# Patient Record
Sex: Male | Born: 1935
Health system: Southern US, Community
[De-identification: ages and names within clinical notes are randomized; demographics above are authoritative.]

## PROBLEM LIST (undated history)

## (undated) DIAGNOSIS — G20 Parkinson's disease: Secondary | ICD-10-CM

## (undated) DIAGNOSIS — G20A1 Parkinson's disease without dyskinesia, without mention of fluctuations: Secondary | ICD-10-CM

## (undated) DIAGNOSIS — K118 Other diseases of salivary glands: Secondary | ICD-10-CM

## (undated) DIAGNOSIS — H532 Diplopia: Secondary | ICD-10-CM

## (undated) DIAGNOSIS — E785 Hyperlipidemia, unspecified: Secondary | ICD-10-CM

## (undated) DIAGNOSIS — R079 Chest pain, unspecified: Secondary | ICD-10-CM

## (undated) DIAGNOSIS — E7800 Pure hypercholesterolemia, unspecified: Secondary | ICD-10-CM

## (undated) DIAGNOSIS — I2510 Atherosclerotic heart disease of native coronary artery without angina pectoris: Secondary | ICD-10-CM

## (undated) DIAGNOSIS — I872 Venous insufficiency (chronic) (peripheral): Secondary | ICD-10-CM

## (undated) DIAGNOSIS — I519 Heart disease, unspecified: Secondary | ICD-10-CM

## (undated) DIAGNOSIS — R42 Dizziness and giddiness: Secondary | ICD-10-CM

## (undated) DIAGNOSIS — R6881 Early satiety: Secondary | ICD-10-CM

## (undated) DIAGNOSIS — I10 Essential (primary) hypertension: Secondary | ICD-10-CM

## (undated) DIAGNOSIS — R2681 Unsteadiness on feet: Secondary | ICD-10-CM

## (undated) DIAGNOSIS — H538 Other visual disturbances: Secondary | ICD-10-CM

## (undated) DIAGNOSIS — I5189 Other ill-defined heart diseases: Secondary | ICD-10-CM

## (undated) HISTORY — DX: Dizziness and giddiness: R42

## (undated) HISTORY — DX: Venous insufficiency (chronic) (peripheral): I87.2

## (undated) HISTORY — DX: Other ill-defined heart diseases: I51.89

## (undated) HISTORY — DX: Other visual disturbances: H53.8

## (undated) HISTORY — DX: Early satiety: R68.81

## (undated) HISTORY — DX: Other diseases of salivary glands: K11.8

## (undated) HISTORY — DX: Chest pain, unspecified: R07.9

## (undated) HISTORY — DX: Atherosclerotic heart disease of native coronary artery without angina pectoris: I25.10

## (undated) HISTORY — DX: Essential (primary) hypertension: I10

## (undated) HISTORY — DX: Hyperlipidemia, unspecified: E78.5

## (undated) HISTORY — DX: Heart disease, unspecified: I51.9

## (undated) HISTORY — DX: Unsteadiness on feet: R26.81

## (undated) HISTORY — DX: Diplopia: H53.2

## (undated) HISTORY — DX: Pure hypercholesterolemia, unspecified: E78.00

---

## 1998-11-11 ENCOUNTER — Encounter (HOSPITAL_BASED_OUTPATIENT_CLINIC_OR_DEPARTMENT_OTHER): Payer: Self-pay | Admitting: General Surgery

## 1998-11-14 ENCOUNTER — Ambulatory Visit (HOSPITAL_COMMUNITY): Admission: RE | Admit: 1998-11-14 | Discharge: 1998-11-14 | Payer: Self-pay | Admitting: General Surgery

## 2000-04-21 ENCOUNTER — Emergency Department (HOSPITAL_COMMUNITY): Admission: EM | Admit: 2000-04-21 | Discharge: 2000-04-21 | Payer: Self-pay | Admitting: Emergency Medicine

## 2005-10-15 DIAGNOSIS — R079 Chest pain, unspecified: Secondary | ICD-10-CM

## 2005-10-15 HISTORY — DX: Chest pain, unspecified: R07.9

## 2007-10-16 HISTORY — PX: CORONARY ANGIOPLASTY: SHX604

## 2007-12-08 ENCOUNTER — Inpatient Hospital Stay (HOSPITAL_BASED_OUTPATIENT_CLINIC_OR_DEPARTMENT_OTHER): Admission: RE | Admit: 2007-12-08 | Discharge: 2007-12-08 | Payer: Self-pay | Admitting: Interventional Cardiology

## 2010-01-04 ENCOUNTER — Ambulatory Visit (HOSPITAL_COMMUNITY): Admission: RE | Admit: 2010-01-04 | Discharge: 2010-01-04 | Payer: Self-pay | Admitting: General Surgery

## 2011-02-27 NOTE — Cardiovascular Report (Signed)
NAMEPEDER, ALLUMS NO.:  [id number]   MEDICAL RECORD NO.:  [id number]          PATIENT TYPE:  OIB   LOCATION:  1963                         FACILITY:  [facility]   PHYSICIAN:  Lyn Records, M.D.   DATE OF BIRTH:  April 16, 1935   DATE OF PROCEDURE:  12/08/2007  DATE OF DISCHARGE:                            CARDIAC CATHETERIZATION   INDICATIONS FOR PROCEDURE:  Exertional chest burning, prior abnormal  Cardiolite study demonstrating possible inferior wall ischemia in 2005.  Study is being done to define coronary anatomy.   PROCEDURE PERFORMED:  1. Left heart catheterization.  2. Selective coronary angiography.  3. Left ventriculography.   DESCRIPTION:  After informed consent, a 4-French sheath was placed  in  the right femoral artery using a modified Seldinger technique.  A 4-  [place] A2 multipurpose catheter was then used for hemodynamic  recordings, left ventriculography by hand injection, and selective right  coronary angiography.  A 4-French #4 left Judkins catheter was used for  left coronary angiography.  The patient tolerated the procedure without  complication.  Manual compression was used for hemostasis.   RESULTS:  1. Hemodynamic data:  (a) Aortic pressure 117/62.  (b) Left ventricular pressure 119/5.  1. Left ventriculography:  The left ventricle was normal in size.      Contractility is normal.  The ejection fraction is 70%.  2. Coronary angiography:  (a) Left main coronary:  Normal.  (b) Left anterior descending coronary:  Left anterior descending  coronary artery is a large vessel that wraps around left ventricular  apex.  There is an eccentric focal plaque in the ostium of LAD that  obstructs the vessel by less than 25%.  The LAD proper gives origin to  large branching first diagonal that arises from the mid vessel.  The  diagonal was normal.  The LAD beyond the diagonal contains luminal  irregularities, but it is also widely patent.  c)  Circumflex artery:  The circumflex coronary artery is a large vessel  tortuous that gives origin to 2 obtuse marginal branches, the mid vessel  contains a very tortuous segment.  The 2 obtuse marginals are large and  normal.  Irregularities are noted within the region of tortuosity.  (d) Right coronary:  The right coronary artery is also a very tortuous  vessel without any significant obstruction.  PDA is normal.  There was  contrast reflux into the aorta on injection.   CONCLUSION:  1. Normal left ventricular function.  2. Widely patent coronaries.  The patient does have a very eccentric,      focal, ostial LAD lesion that obstructs the vessel by less than      25%.  For the most part, otherwise the vessels are widely patent      with only minimal luminal irregularity.  There is significant      tortuosity in the right coronary and the circumflex.  3. Exertional chest burning of uncertain etiology, not felt to be      ischemic in origin.  Possibilities include COPD versus other.   PLAN:  1. Continue aspirin.  2. Continue antihypertensive therapy.  3. Continue low-dose statin therapy for the ostial LAD plaque.      Lyn Records, M.D.  Electronically Signed     HWS/MEDQ  D:  12/08/2007  T:  12/08/2007  Job:  [id number]   cc:   Lorelle Formosa, M.D.

## 2011-03-02 NOTE — Procedures (Signed)
[place]  Patient:    Gavin Campbell, Gavin Campbell                        MRN: [id number] Proc. Date: 04/21/00 Attending:  Roosvelt Harps, M.D. CC:         Mardene Celeste. Lurene Shadow, M.D.                           Procedure Report  PROCEDURE PERFORMED:  Video upper endoscopy.  ENDOSCOPIST:  Roosvelt Harps, M.D.  INDICATIONS FOR PROCEDURE:  Possible foreign body in the proximal esophagus. The patient is a 76 year old male who says he swallowed a toothpick several days ago and feels it lodged high in the cervical area of the esophagus.  He has no significant underlying medical conditions and is taking no chronic medications.  PHYSICAL EXAMINATION:  Neck is supple without thyromegaly, adenopathy or crepitus.  Chest is clear.  Heart sounds are regular rate and rhythm without murmurs, gallops or rubs and the abdomen is benign.  PREPARATION:  He is n.p.o. since midnight.  PREPROCEDURE SEDATION:  He received 50 mg of Demerol and 5 mg of Versed intravenously.  In addition, his throat was anesthetized with Cetacaine spray and he was on 2L of nasal cannula O2.  DESCRIPTION OF PROCEDURE:  The Olympus video upper endoscope was inserted via the mouth and advanced easily through the upper esophageal sphincter.  Careful attention was paid to the posterior pharyngeal area in the proximal esophagus. When nothing was noticed, intubation was carried on down to the descending duodenum.  On withdrawal, the mucosa was carefully evaluated.  The descending duodenum, bulb, entire stomach and retroflex view of the gastroesophageal junction were normal.  The scope was withdrawn through the entire length of the esophagus into the posterior pharynx and both vallecular areas were re-examined.  The esophagus was reintubated three times and no foreign body could be located in the proximal esophagus.  The patient tolerated the procedure well.  Pulse, blood pressure and oximetry testing were  stable throughout.  He was observed in recovery for 45 minutes and discharged home alert with a benign abdomen.  IMPRESSION:  Foreign body sensation but no obvious foreign body still present in the proximal esophagus or posterior pharynx.  PLAN:  Patient is to gargle with Chloroseptic and call me within 24 to 48 hours if the symptoms are persisting.  At that time we will get a soft tissue film of the neck and consider ENT referral if the problem persists. DD:  04/21/00 TD:  04/21/00 Job: [id number]

## 2015-10-16 DIAGNOSIS — K118 Other diseases of salivary glands: Secondary | ICD-10-CM

## 2015-10-16 HISTORY — DX: Other diseases of salivary glands: K11.8

## 2015-11-02 ENCOUNTER — Other Ambulatory Visit: Payer: Self-pay | Admitting: Family Medicine

## 2015-11-02 DIAGNOSIS — R42 Dizziness and giddiness: Secondary | ICD-10-CM

## 2015-11-08 ENCOUNTER — Ambulatory Visit
Admission: RE | Admit: 2015-11-08 | Discharge: 2015-11-08 | Disposition: A | Discharge status: Home / Self Care | Payer: Medicare Other | Source: Ambulatory Visit | Attending: Family Medicine | Admitting: Family Medicine

## 2015-11-08 DIAGNOSIS — R42 Dizziness and giddiness: Secondary | ICD-10-CM

## 2015-11-10 ENCOUNTER — Other Ambulatory Visit: Payer: Self-pay

## 2015-11-29 DIAGNOSIS — K118 Other diseases of salivary glands: Secondary | ICD-10-CM | POA: Insufficient documentation

## 2016-02-06 ENCOUNTER — Ambulatory Visit: Payer: Self-pay | Admitting: Sports Medicine

## 2016-02-13 ENCOUNTER — Ambulatory Visit (INDEPENDENT_AMBULATORY_CARE_PROVIDER_SITE_OTHER): Payer: Medicare Other | Admitting: Sports Medicine

## 2016-02-13 ENCOUNTER — Encounter: Payer: Self-pay | Admitting: Sports Medicine

## 2016-02-13 DIAGNOSIS — M79675 Pain in left toe(s): Secondary | ICD-10-CM

## 2016-02-13 DIAGNOSIS — M79674 Pain in right toe(s): Secondary | ICD-10-CM

## 2016-02-13 DIAGNOSIS — B351 Tinea unguium: Secondary | ICD-10-CM | POA: Diagnosis not present

## 2016-02-13 NOTE — Progress Notes (Signed)
Patient ID: Gavin Campbell, male   DOB: May 01, 1935, 81 y.o.   MRN: [id number] Subjective: Gavin Campbell is a 81 y.o. male patient seen today in office with complaint of painful thickened and elongated toenails; unable to trim. Patient denies history of Diabetes, Neuropathy, or Vascular disease. Patient has no other pedal complaints at this time.   Patient Active Problem List   Diagnosis Date Noted  . Mass of parotid gland 11/29/2015    No current outpatient prescriptions on file prior to visit.   No current facility-administered medications on file prior to visit.    No Known Allergies  Objective: Physical Exam  General: Well developed, nourished, no acute distress, awake, alert and oriented x 3  Vascular: Dorsalis pedis artery 2/4 bilateral, Posterior tibial artery 1/4 bilateral, skin temperature warm to warm proximal to distal bilateral lower extremities, no varicosities, pedal hair present bilateral.  Neurological: Gross sensation present via light touch bilateral.   Dermatological: Skin is warm, dry, and supple bilateral, Nails 1-10 are tender, long, thick, and discolored with mild subungal debris, no webspace macerations present bilateral, no open lesions present bilateral, no callus/corns/hyperkeratotic tissue present bilateral. No signs of infection bilateral.  Musculoskeletal: Bunion and hammertoe deformities noted bilateral. Muscular strength within normal limits without painon range of motion. No pain with calf compression bilateral.  Assessment and Plan:  Problem List Items Addressed This Visit    None    Visit Diagnoses    Dermatophytosis of nail    -  Primary    Toe pain, bilateral           -Examined patient.  -Discussed treatment options for painful mycotic nails. -Mechanically debrided and reduced mycotic nails with sterile nail nipper and dremel nail file without incident. -Recommend good support shoes for foot type -Patient to return in 3 months for  follow up evaluation or sooner if symptoms worsen.  Landis Martins, DPM

## 2016-02-27 ENCOUNTER — Other Ambulatory Visit: Payer: Self-pay | Admitting: Otolaryngology

## 2016-02-27 DIAGNOSIS — R490 Dysphonia: Secondary | ICD-10-CM | POA: Insufficient documentation

## 2016-02-27 DIAGNOSIS — J302 Other seasonal allergic rhinitis: Secondary | ICD-10-CM | POA: Insufficient documentation

## 2016-02-27 DIAGNOSIS — K118 Other diseases of salivary glands: Secondary | ICD-10-CM

## 2016-02-29 ENCOUNTER — Other Ambulatory Visit (HOSPITAL_COMMUNITY): Payer: Self-pay | Admitting: Otolaryngology

## 2016-02-29 DIAGNOSIS — K118 Other diseases of salivary glands: Secondary | ICD-10-CM

## 2016-03-06 ENCOUNTER — Ambulatory Visit (HOSPITAL_COMMUNITY)
Admission: RE | Admit: 2016-03-06 | Discharge: 2016-03-06 | Disposition: A | Discharge status: Home / Self Care | Payer: Medicare Other | Source: Ambulatory Visit | Attending: Otolaryngology | Admitting: Otolaryngology

## 2016-03-06 DIAGNOSIS — K119 Disease of salivary gland, unspecified: Secondary | ICD-10-CM | POA: Insufficient documentation

## 2016-03-06 DIAGNOSIS — K118 Other diseases of salivary glands: Secondary | ICD-10-CM

## 2016-03-16 ENCOUNTER — Ambulatory Visit (HOSPITAL_COMMUNITY)
Admission: RE | Admit: 2016-03-16 | Discharge: 2016-03-16 | Disposition: A | Discharge status: Home / Self Care | Payer: Medicare Other | Source: Ambulatory Visit | Attending: Otolaryngology | Admitting: Otolaryngology

## 2016-03-16 ENCOUNTER — Encounter (HOSPITAL_COMMUNITY): Payer: Self-pay

## 2016-03-16 ENCOUNTER — Other Ambulatory Visit (HOSPITAL_COMMUNITY): Payer: Self-pay | Admitting: Otolaryngology

## 2016-03-16 DIAGNOSIS — Z87891 Personal history of nicotine dependence: Secondary | ICD-10-CM | POA: Insufficient documentation

## 2016-03-16 DIAGNOSIS — D110 Benign neoplasm of parotid gland: Secondary | ICD-10-CM | POA: Diagnosis not present

## 2016-03-16 DIAGNOSIS — Z7982 Long term (current) use of aspirin: Secondary | ICD-10-CM | POA: Insufficient documentation

## 2016-03-16 DIAGNOSIS — K118 Other diseases of salivary glands: Secondary | ICD-10-CM

## 2016-03-16 DIAGNOSIS — R220 Localized swelling, mass and lump, head: Secondary | ICD-10-CM | POA: Diagnosis present

## 2016-03-16 LAB — APTT: APTT: 30 s (ref 24–37)

## 2016-03-16 LAB — CBC
HCT: 42.1 % (ref 39.0–52.0)
Hemoglobin: 13.5 g/dL (ref 13.0–17.0)
MCH: 27.5 pg (ref 26.0–34.0)
MCHC: 32.1 g/dL (ref 30.0–36.0)
MCV: 85.7 fL (ref 78.0–100.0)
PLATELETS: 157 10*3/uL (ref 150–400)
RBC: 4.91 MIL/uL (ref 4.22–5.81)
RDW: 14.3 % (ref 11.5–15.5)
WBC: 6 10*3/uL (ref 4.0–10.5)

## 2016-03-16 LAB — PROTIME-INR
INR: 1.08 (ref 0.00–1.49)
PROTHROMBIN TIME: 14.2 s (ref 11.6–15.2)

## 2016-03-16 MED ORDER — FENTANYL CITRATE (PF) 100 MCG/2ML IJ SOLN
INTRAMUSCULAR | Status: AC | PRN
Start: 2016-03-16 — End: 2016-03-16
  Administered 2016-03-16: 25 ug via INTRAVENOUS

## 2016-03-16 MED ORDER — MIDAZOLAM HCL 2 MG/2ML IJ SOLN
INTRAMUSCULAR | Status: AC
  Filled 2016-03-16: qty 2

## 2016-03-16 MED ORDER — MIDAZOLAM HCL 2 MG/2ML IJ SOLN
INTRAMUSCULAR | Status: AC | PRN
  Administered 2016-03-16: 1 mg via INTRAVENOUS

## 2016-03-16 MED ORDER — LIDOCAINE HCL (PF) 1 % IJ SOLN
INTRAMUSCULAR | Status: AC
  Filled 2016-03-16: qty 10

## 2016-03-16 MED ORDER — FENTANYL CITRATE (PF) 100 MCG/2ML IJ SOLN
INTRAMUSCULAR | Status: AC
  Filled 2016-03-16: qty 2

## 2016-03-16 MED ORDER — SODIUM CHLORIDE 0.9 % IV SOLN: INTRAVENOUS | Status: DC

## 2016-03-16 NOTE — H&P (Signed)
Chief Complaint: Patient was seen in consultation today for left parotid gland mass biopsy at the request of [facility]  Referring Physician(s): Jerrell Belfast  Supervising Physician: Corrie Mckusick  Patient Status: Out-pt  History of Present Illness: Gavin Campbell is a 81 y.o. male   Pt was seen for dizziness and blurred vision 10/2015 Incidental L parotid mass noted Follow up US still reveals mass 03/06/16: IMPRESSION: Unchanged morphologic appearance of the approximately 2.4 cm cystic lesion within the left parotid gland. Fall stability suggests a benign etiology, this lesion remains indeterminate and ENT consultation is advised.  Was referred to Dr Wilburn Cornelia and bx requested   History reviewed. No pertinent past medical history.  History reviewed. No pertinent past surgical history.  Allergies: Review of patient's allergies indicates no known allergies.  Medications: Prior to Admission medications   Medication Sig Start Date End Date Taking? Authorizing Provider  aspirin 81 MG tablet Take 81 mg by mouth daily.   Yes Historical Provider, MD     History reviewed. No pertinent family history.  Social History   Social History  . Marital Status: Married    Spouse Name: N/A  . Number of Children: N/A  . Years of Education: N/A   Social History Main Topics  . Smoking status: Former Research scientist (life sciences)  . Smokeless tobacco: None  . Alcohol Use: None  . Drug Use: None  . Sexual Activity: Not Asked   Other Topics Concern  . None   Social History Narrative     Review of Systems: A 12 point ROS discussed and pertinent positives are indicated in the HPI above.  All other systems are negative.  Review of Systems  Constitutional: Negative for fever, activity change, appetite change and fatigue.  Respiratory: Negative for shortness of breath.   Musculoskeletal: Negative for neck pain.  Neurological: Negative for weakness.  Psychiatric/Behavioral: Negative for  behavioral problems and confusion.    Vital Signs: BP 133/78 mmHg  Pulse 64  Temp(Src) 97.9 F (36.6 C)  Resp 16  Ht 5\' 11"  (1.803 m)  Wt 157 lb (71.215 kg)  BMI 21.91 kg/m2  SpO2 100%  Physical Exam  Constitutional: He is oriented to person, place, and time.  Cardiovascular: Normal rate, regular rhythm and normal heart sounds.   Pulmonary/Chest: Effort normal and breath sounds normal. He has no wheezes.  Abdominal: Soft. Bowel sounds are normal. There is no tenderness.  Musculoskeletal: Normal range of motion.  Neurological: He is alert and oriented to person, place, and time.  Skin: Skin is warm and dry.  Psychiatric: He has a normal mood and affect. His behavior is normal. Judgment and thought content normal.  Nursing note and vitals reviewed.   Mallampati Score:  MD Evaluation Airway: WNL Heart: WNL Abdomen: WNL Chest/ Lungs: WNL ASA  Classification: 2 Mallampati/Airway Score: One  Imaging: US Soft Tissue Head/neck  03/06/2016  CLINICAL DATA:  Left parotid mass seen on MRI. EXAM: ULTRASOUND OF HEAD/NECK SOFT TISSUES TECHNIQUE: Ultrasound examination of the head and neck soft tissues was performed in the area of clinical concern. COMPARISON:  Brain MRI -11/07/2025 FINDINGS: Grossly unchanged appearance of slightly serpiginous approximately 2.4 x 1.9 x 1.4 cm anechoic cystic lesion within the left parotid gland, morphologically similar to the 10/2015 brain MRI. No definitive new or enlarging parotid lesions. No evidence of parotid ductal dilatation. No evidence of regional cervical lymphadenopathy. IMPRESSION: Unchanged morphologic appearance of the approximately 2.4 cm cystic lesion within the left parotid gland. Fall stability suggests a  benign etiology, this lesion remains indeterminate and ENT consultation is advised. Electronically Signed   By: Sandi Mariscal M.D.   On: 03/06/2016 16:04    Labs:  CBC:  Recent Labs  03/16/16 1157  WBC 6.0  HGB 13.5  HCT 42.1  PLT  157    COAGS:  Recent Labs  03/16/16 1157  INR 1.08  APTT 30    BMP: No results for input(s): NA, K, CL, CO2, GLUCOSE, BUN, CALCIUM, CREATININE, GFRNONAA, GFRAA in the last 8760 hours.  Invalid input(s): CMP  LIVER FUNCTION TESTS: No results for input(s): BILITOT, AST, ALT, ALKPHOS, PROT, ALBUMIN in the last 8760 hours.  TUMOR MARKERS: No results for input(s): AFPTM, CEA, CA199, CHROMGRNA in the last 8760 hours.  Assessment and Plan:  Left parotid gland mass persistent since 10/2015 Now for bx Risks and Benefits discussed with the patient including, but not limited to bleeding, infection, damage to adjacent structures or low yield requiring additional tests. All of the patient's questions were answered, patient is agreeable to proceed. Consent signed and in chart.    Thank you for this interesting consult.  I greatly enjoyed meeting CARRELL GENTILCORE and look forward to participating in their care.  A copy of this report was sent to the requesting provider on this date.  Electronically Signed: Monia Sabal A 03/16/2016, 12:13 PM   I spent a total of 30 minutes    in face to face in clinical consultation, greater than 50% of which was counseling/coordinating care for Left parotid gland mass bx

## 2016-03-16 NOTE — Sedation Documentation (Signed)
Vital signs stable. 

## 2016-03-16 NOTE — Procedures (Signed)
Interventional Radiology Procedure Note  Procedure: US guided left parotid mass biopsy.  2 x 18g core.  Complications: None Recommendations:  - Ok to shower tomorrow - Do not submerge for 7 days - Routine line care    Signed,  Dulcy Fanny. Earleen Newport, DO

## 2016-03-16 NOTE — Sedation Documentation (Signed)
Patient is resting comfortably. 

## 2016-03-16 NOTE — Discharge Instructions (Signed)
Needle Biopsy, Care After °These instructions give you information about caring for yourself after your procedure. Your doctor may also give you more specific instructions. Call your doctor if you have any problems or questions after your procedure. °HOME CARE °· Rest as told by your doctor. °· Take medicines only as told by your doctor. °· There are many different ways to close and cover the biopsy site, including stitches (sutures), skin glue, and adhesive strips. Follow instructions from your doctor about: °¨ How to take care of your biopsy site. °¨ When and how you should change your bandage (dressing). °¨ When you should remove your dressing. °¨ Removing whatever was used to close your biopsy site. °· Check your biopsy site every day for signs of infection. Watch for: °¨ Redness, swelling, or pain. °¨ Fluid, blood, or pus. °GET HELP IF: °· You have a fever. °· You have redness, swelling, or pain at the biopsy site, and it lasts longer than a few days. °· You have fluid, blood, or pus coming from the biopsy site. °· You feel sick to your stomach (nauseous). °· You throw up (vomit). °GET HELP RIGHT AWAY IF: °· You are short of breath. °· You have trouble breathing. °· Your chest hurts. °· You feel dizzy or you pass out (faint). °· You have bleeding that does not stop with pressure or a bandage. °· You cough up blood. °· Your belly (abdomen) hurts. °  °This information is not intended to replace advice given to you by your health care provider. Make sure you discuss any questions you have with your health care provider. °  °Document Released: 09/13/2008 Document Revised: 02/15/2015 Document Reviewed: 09/27/2014 °Elsevier Interactive Patient Education ©2016 [employer] ° °

## 2016-10-23 DIAGNOSIS — K119 Disease of salivary gland, unspecified: Secondary | ICD-10-CM | POA: Diagnosis not present

## 2016-10-23 DIAGNOSIS — E7800 Pure hypercholesterolemia, unspecified: Secondary | ICD-10-CM | POA: Diagnosis not present

## 2016-10-23 DIAGNOSIS — I10 Essential (primary) hypertension: Secondary | ICD-10-CM | POA: Diagnosis not present

## 2017-02-25 DIAGNOSIS — R42 Dizziness and giddiness: Secondary | ICD-10-CM | POA: Diagnosis not present

## 2017-02-25 DIAGNOSIS — I872 Venous insufficiency (chronic) (peripheral): Secondary | ICD-10-CM | POA: Diagnosis not present

## 2017-02-25 DIAGNOSIS — I10 Essential (primary) hypertension: Secondary | ICD-10-CM | POA: Diagnosis not present

## 2017-03-14 DIAGNOSIS — R002 Palpitations: Secondary | ICD-10-CM | POA: Diagnosis not present

## 2017-04-03 DIAGNOSIS — R079 Chest pain, unspecified: Secondary | ICD-10-CM | POA: Insufficient documentation

## 2017-04-03 NOTE — Progress Notes (Signed)
Cardiology Office Note    Date:  04/04/2017   ID:  Gavin Campbell, DOB 02-03-1935, MRN [id number]  PCP:  Vernie Shanks, MD  Cardiologist:  Dr. Tamala Julian   CC: palpitations  History of Present Illness:  Gavin Campbell is a 82 y.o. male with a history of HTN, HLD, non obstructive CAD who presents to clinic for new patient evaluation of palpitations.   He has a history of an abnormal nuclear stress test in 2005 and exertional chest burning with subsequent cath in 2006 that showed widely patent coronary arteries with 25% oLAD stenosis.   Labs reviewed from 10/2016: Creat 1.11, K 4.4, AST/ALT 20/16, TC 129, TG 82, HDL 43, LDL 70.   He recently saw Dr. Harrington Challenger for evaluation of palpitations. Last week he had two episodes of palpitations that lasted for minutes. One episode was so significant that he almost called EMS, but then it self resolved. He is unable to explain these palpitations but he thinks his heart felt like his heart was racing. He did not feel dizzy or have syncope, although he does get dizzy from time to time with positional changes. No chest pain but had associated SOB. Also recently feeling like he has been having some memory issues. He actually missed two red lights because it did not "click" that he needed to stop.  He feels like he is "slow to get moving" and has to wait a little bit before he can "get going." Also, said his friend noticed his hand tremoring. Patient is somewhat of a difficult historian.     Past Medical History:  Diagnosis Date  . Blurred vision   . Chest pain 2007   ABNORMAL CARDIOLITE   . Coronary arteriosclerosis in native artery   . Diplopia   . Early satiety   . Hyperlipidemia   . Hypertension   . Left ventricular systolic dysfunction   . Orthostatic dizziness   . Parotid mass   . Pure hypercholesterolemia   . Unsteady gait   . Venous stasis dermatitis of right lower extremity     Past Surgical History:  Procedure Laterality Date  .  CORONARY ANGIOPLASTY  2009   NON OBSTRUCTIVE DISEASE    Current Medications: Outpatient Medications Prior to Visit  Medication Sig Dispense Refill  . amLODipine (NORVASC) 2.5 MG tablet Take 2.5 mg by mouth daily.    Marland Kitchen aspirin 81 MG tablet Take 81 mg by mouth daily.    . Coenzyme Q10 10 MG capsule Take 10 mg by mouth daily.    . metoprolol succinate (TOPROL-XL) 25 MG 24 hr tablet Take 25 mg by mouth daily.    . ranitidine (ZANTAC) 150 MG tablet Take 150 mg by mouth at bedtime.    . simvastatin (ZOCOR) 20 MG tablet Take 20 mg by mouth daily.    . TRIAMCINOLONE ACETONIDE EX Apply topically 2 (two) times daily.     No facility-administered medications prior to visit.      Allergies:   Patient has no known allergies.   Social History   Social History  . Marital status: Married    Spouse name: N/A  . Number of children: N/A  . Years of education: N/A   Social History Main Topics  . Smoking status: Former Smoker    Quit date: 04/03/1969  . Smokeless tobacco: Never Used  . Alcohol use No  . Drug use: No  . Sexual activity: Not Asked     Comment: MARRIED  Other Topics Concern  . None   Social History Narrative  . None     Family History:  The patient's family history includes Healthy in his father and mother.     ROS:   Please see the history of present illness.    ROS All other systems reviewed and are negative.   PHYSICAL EXAM:   VS:  BP 118/78 (BP Location: Left Arm)   Pulse 64   Ht 5\' 11"  (1.803 m)   Wt 153 lb 12.8 oz (69.8 kg)   BMI 21.45 kg/m    GEN: Well nourished, well developed, in no acute distress  HEENT: normal  Neck: no JVD, carotid bruits, or masses Cardiac: RRR; no murmurs, rubs, or gallops,no edema  Respiratory:  clear to auscultation bilaterally, normal work of breathing GI: soft, nontender, nondistended, + BS MS: no deformity or atrophy  Skin: warm and dry, no rash Neuro:  Alert and Oriented x 3, Strength and sensation are intact Psych:  euthymic mood, full affect    Wt Readings from Last 3 Encounters:  04/04/17 153 lb 12.8 oz (69.8 kg)  03/16/16 157 lb (71.2 kg)      Studies/Labs Reviewed:   EKG:  EKG is ordered today.  The ekg ordered today demonstrates sinus bradycardia  HR 55   Recent Labs: No results found for requested labs within last 8760 hours.   Lipid Panel No results found for: CHOL, TRIG, HDL, CHOLHDL, VLDL, LDLCALC, LDLDIRECT  Additional studies/ records that were reviewed today include:  Cath 2009  CONCLUSION:  1. Normal left ventricular function.  2. Widely patent coronaries.  The patient does have a very eccentric,      focal, ostial LAD lesion that obstructs the vessel by less than      25%.  For the most part, otherwise the vessels are widely patent      with only minimal luminal irregularity.  There is significant      tortuosity in the right coronary and the circumflex.  3. Exertional chest burning of uncertain etiology, not felt to be      ischemic in origin.  Possibilities include COPD versus other.   PLAN:  1. Continue aspirin.  2. Continue antihypertensive therapy.  3. Continue low-dose statin therapy for the ostial LAD plaque.    ASSESSMENT & PLAN:   Palpitations: will check a BMET, TSH, and place a 30 day event monitor. Continue home Toprol XL 25mg  daily.   HTN: BP stable: 110/72 upon my personal recheck.   HLD: continue statin   Neuro: he has been having some tremor (although I do not appreciate this on physical exam today), cognitive dysfunction ( missed two red lights recently and forgets what he is talking about mid sentence), and motor difficulties (can't seem to get moving). I will refer him to neurology for further evaluation    Medication Adjustments/Labs and Tests Ordered: Current medicines are reviewed at length with the patient today.  Concerns regarding medicines are outlined above.  Medication changes, Labs and Tests ordered today are listed in the Patient  Instructions below. Patient Instructions  Medication Instructions:  Your physician recommends that you continue on your current medications as directed. Please refer to the Current Medication list given to you today.   Labwork: TODAY:  TSH & BMET  Testing/Procedures: Your physician has recommended that you wear an event monitor. Event monitors are medical devices that record the heart's electrical activity. Doctors most often [place] these monitors to diagnose arrhythmias. Arrhythmias  are problems with the speed or rhythm of the heartbeat. The monitor is a small, portable device. You can wear one while you do your normal daily activities. This is usually used to diagnose what is causing palpitations/syncope (passing out).  You have been referred to [facility].  THEY WILL CONTACT YOU TO SCHEDULE AN APPOINTMENT.     Follow-Up: Your physician recommends that you schedule a follow-up appointment in: [place], PA-C   Any Other Special Instructions Will Be Listed Below (If Applicable).   Cardiac Event Monitoring A cardiac event monitor is a small recording device that is used to detect abnormal heart rhythms (arrhythmias). The monitor is used to record your heart rhythm when you have symptoms, such as:  Fast heartbeats (palpitations), such as heart racing or fluttering.  Dizziness.  Fainting or light-headedness.  Unexplained weakness.  Some monitors are wired to electrodes placed on your chest. Electrodes are flat, sticky disks that attach to your skin. Other monitors may be hand-held or worn on the wrist. The monitor can be worn for up to 30 days. If the monitor is attached to your chest, a technician will prepare your chest for the electrode placement and show you how to work the monitor. Take time to practice using the monitor before you leave the office. Make sure you understand how to send the information from the monitor to your health care provider. In some  cases, you may need to use a landline telephone instead of a cell phone. What are the risks? Generally, this device is safe to use, but it possible that the skin under the electrodes will become irritated. How to use your cardiac event monitor  Wear your monitor at all times, except when you are in water: ? Do not let the monitor get wet. ? Take the monitor off when you bathe. Do not swim or use a hot tub with it on.  Keep your skin clean. Do not put body lotion or moisturizer on your chest.  Change the electrodes as told by your health care provider or any time they stop sticking to your skin. You may need to use medical tape to keep them on.  Try to put the electrodes in slightly different places on your chest to help prevent skin irritation. They must remain in the area under your left breast and in the upper right section of your chest.  Make sure the monitor is safely clipped to your clothing or in a location close to your body that your health care provider recommends.  Press the button to record as soon as you feel heart-related symptoms, such as: ? Dizziness. ? Weakness. ? Light-headedness. ? Palpitations. ? Thumping or pounding in your chest. ? Shortness of breath. ? Unexplained weakness.  Keep a diary of your activities, such as walking, doing chores, and taking medicine. It is very important to note what you were doing when you pushed the button to record your symptoms. This will help your health care provider determine what might be contributing to your symptoms.  Send the recorded information as recommended by your health care provider. It may take some time for your health care provider to process the results.  Change the batteries as told by your health care provider.  Keep electronic devices away from your monitor. This includes: ? Tablets. ? MP3 players. ? Cell phones.  While wearing your monitor you should avoid: ? Electric blankets. ? Tax inspector. ? Electric toothbrushes. ? Microwave ovens. ?  Magnets. ? Metal detectors. Get help right away if:  You have chest pain.  You have extreme difficulty breathing or shortness of breath.  You develop a very fast heartbeat that persists.  You develop dizziness that does not go away.  You faint or constantly feel like you are about to faint. Summary  A cardiac event monitor is a small recording device that is used to help detect abnormal heart rhythms (arrhythmias).  The monitor is used to record your heart rhythm when you have heart-related symptoms.  Make sure you understand how to send the information from the monitor to your health care provider.  It is important to press the button on the monitor when you have any heart-related symptoms.  Keep a diary of your activities, such as walking, doing chores, and taking medicine. It is very important to note what you were doing when you pushed the button to record your symptoms. This will help your health care provider learn what might be causing your symptoms. This information is not intended to replace advice given to you by your health care provider. Make sure you discuss any questions you have with your health care provider. Document Released: 07/10/2008 Document Revised: 09/15/2016 Document Reviewed: 09/15/2016 Elsevier Interactive Patient Education  2017 [employer].   If you need a refill on your cardiac medications before your next appointment, please call your pharmacy.      Signed, Angelena Form, PA-C  04/04/2017 9:50 AM    [facility] [place] [place], [place], [place]  [address] Phone: [phone]; Fax: [phone]

## 2017-04-04 ENCOUNTER — Ambulatory Visit (INDEPENDENT_AMBULATORY_CARE_PROVIDER_SITE_OTHER): Payer: Medicare PPO | Admitting: Physician Assistant

## 2017-04-04 ENCOUNTER — Ambulatory Visit (INDEPENDENT_AMBULATORY_CARE_PROVIDER_SITE_OTHER): Payer: Medicare PPO

## 2017-04-04 ENCOUNTER — Encounter: Payer: Self-pay | Admitting: Neurology

## 2017-04-04 ENCOUNTER — Encounter: Payer: Self-pay | Admitting: Physician Assistant

## 2017-04-04 VITALS — BP 118/78 | HR 64 | Ht 71.0 in | Wt 153.8 lb

## 2017-04-04 DIAGNOSIS — R002 Palpitations: Secondary | ICD-10-CM

## 2017-04-04 DIAGNOSIS — I10 Essential (primary) hypertension: Secondary | ICD-10-CM | POA: Diagnosis not present

## 2017-04-04 DIAGNOSIS — F09 Unspecified mental disorder due to known physiological condition: Secondary | ICD-10-CM | POA: Diagnosis not present

## 2017-04-04 DIAGNOSIS — E785 Hyperlipidemia, unspecified: Secondary | ICD-10-CM | POA: Diagnosis not present

## 2017-04-04 LAB — BASIC METABOLIC PANEL
BUN/Creatinine Ratio: 16 (ref 10–24)
BUN: 19 mg/dL (ref 8–27)
CO2: 20 mmol/L (ref 20–29)
CREATININE: 1.19 mg/dL (ref 0.76–1.27)
Calcium: 9.7 mg/dL (ref 8.6–10.2)
Chloride: 105 mmol/L (ref 96–106)
GFR calc Af Amer: 66 mL/min/{1.73_m2} (ref >59)
GFR, EST NON AFRICAN AMERICAN: 57 mL/min/{1.73_m2} — AB (ref >59)
Glucose: 87 mg/dL (ref 65–99)
Potassium: 4.4 mmol/L (ref 3.5–5.2)
Sodium: 142 mmol/L (ref 134–144)

## 2017-04-04 LAB — TSH: TSH: 1.52 u[IU]/mL (ref 0.450–4.500)

## 2017-04-04 NOTE — Patient Instructions (Addendum)
Medication Instructions:  Your physician recommends that you continue on your current medications as directed. Please refer to the Current Medication list given to you today.   Labwork: TODAY:  TSH & BMET  Testing/Procedures: Your physician has recommended that you wear an event monitor. Event monitors are medical devices that record the heart's electrical activity. Doctors most often [place] these monitors to diagnose arrhythmias. Arrhythmias are problems with the speed or rhythm of the heartbeat. The monitor is a small, portable device. You can wear one while you do your normal daily activities. This is usually used to diagnose what is causing palpitations/syncope (passing out).  You have been referred to [facility].  THEY WILL CONTACT YOU TO SCHEDULE AN APPOINTMENT.     Follow-Up: Your physician recommends that you schedule a follow-up appointment in: [place], PA-C   Any Other Special Instructions Will Be Listed Below (If Applicable).   Cardiac Event Monitoring A cardiac event monitor is a small recording device that is used to detect abnormal heart rhythms (arrhythmias). The monitor is used to record your heart rhythm when you have symptoms, such as:  Fast heartbeats (palpitations), such as heart racing or fluttering.  Dizziness.  Fainting or light-headedness.  Unexplained weakness.  Some monitors are wired to electrodes placed on your chest. Electrodes are flat, sticky disks that attach to your skin. Other monitors may be hand-held or worn on the wrist. The monitor can be worn for up to 30 days. If the monitor is attached to your chest, a technician will prepare your chest for the electrode placement and show you how to work the monitor. Take time to practice using the monitor before you leave the office. Make sure you understand how to send the information from the monitor to your health care provider. In some cases, you may need to use a landline  telephone instead of a cell phone. What are the risks? Generally, this device is safe to use, but it possible that the skin under the electrodes will become irritated. How to use your cardiac event monitor  Wear your monitor at all times, except when you are in water: ? Do not let the monitor get wet. ? Take the monitor off when you bathe. Do not swim or use a hot tub with it on.  Keep your skin clean. Do not put body lotion or moisturizer on your chest.  Change the electrodes as told by your health care provider or any time they stop sticking to your skin. You may need to use medical tape to keep them on.  Try to put the electrodes in slightly different places on your chest to help prevent skin irritation. They must remain in the area under your left breast and in the upper right section of your chest.  Make sure the monitor is safely clipped to your clothing or in a location close to your body that your health care provider recommends.  Press the button to record as soon as you feel heart-related symptoms, such as: ? Dizziness. ? Weakness. ? Light-headedness. ? Palpitations. ? Thumping or pounding in your chest. ? Shortness of breath. ? Unexplained weakness.  Keep a diary of your activities, such as walking, doing chores, and taking medicine. It is very important to note what you were doing when you pushed the button to record your symptoms. This will help your health care provider determine what might be contributing to your symptoms.  Send the recorded information as recommended by your  health care provider. It may take some time for your health care provider to process the results.  Change the batteries as told by your health care provider.  Keep electronic devices away from your monitor. This includes: ? Tablets. ? MP3 players. ? Cell phones.  While wearing your monitor you should avoid: ? Electric blankets. ? Armed forces operational officer. ? Electric toothbrushes. ? Microwave  ovens. ? Magnets. ? Metal detectors. Get help right away if:  You have chest pain.  You have extreme difficulty breathing or shortness of breath.  You develop a very fast heartbeat that persists.  You develop dizziness that does not go away.  You faint or constantly feel like you are about to faint. Summary  A cardiac event monitor is a small recording device that is used to help detect abnormal heart rhythms (arrhythmias).  The monitor is used to record your heart rhythm when you have heart-related symptoms.  Make sure you understand how to send the information from the monitor to your health care provider.  It is important to press the button on the monitor when you have any heart-related symptoms.  Keep a diary of your activities, such as walking, doing chores, and taking medicine. It is very important to note what you were doing when you pushed the button to record your symptoms. This will help your health care provider learn what might be causing your symptoms. This information is not intended to replace advice given to you by your health care provider. Make sure you discuss any questions you have with your health care provider. Document Released: 07/10/2008 Document Revised: 09/15/2016 Document Reviewed: 09/15/2016 Elsevier Interactive Patient Education  2017 [employer].   If you need a refill on your cardiac medications before your next appointment, please call your pharmacy.

## 2017-05-10 ENCOUNTER — Encounter: Payer: Self-pay | Admitting: *Deleted

## 2017-05-10 NOTE — Progress Notes (Signed)
Patient ID: Gavin Campbell, male   DOB: 06-13-1935, 82 y.o.   MRN: [id number] [employer]/ Biotel MCT-1 patch monitor returned to our office.  Will forward to [employer]/ [employer] using prepaid UPS packaging included in monitor kit.

## 2017-05-16 ENCOUNTER — Ambulatory Visit: Payer: Medicare PPO | Admitting: Physician Assistant

## 2017-05-21 DIAGNOSIS — L309 Dermatitis, unspecified: Secondary | ICD-10-CM | POA: Diagnosis not present

## 2017-05-24 ENCOUNTER — Other Ambulatory Visit: Payer: Medicare PPO

## 2017-05-24 ENCOUNTER — Ambulatory Visit (INDEPENDENT_AMBULATORY_CARE_PROVIDER_SITE_OTHER): Payer: Medicare PPO | Admitting: Neurology

## 2017-05-24 ENCOUNTER — Encounter: Payer: Self-pay | Admitting: Neurology

## 2017-05-24 VITALS — BP 120/72 | HR 48 | Ht 71.0 in | Wt 154.0 lb

## 2017-05-24 DIAGNOSIS — F03A0 Unspecified dementia, mild, without behavioral disturbance, psychotic disturbance, mood disturbance, and anxiety: Secondary | ICD-10-CM

## 2017-05-24 DIAGNOSIS — F0390 Unspecified dementia without behavioral disturbance: Secondary | ICD-10-CM | POA: Diagnosis not present

## 2017-05-24 MED ORDER — MEMANTINE HCL 5 MG PO TABS: ORAL_TABLET | ORAL | 11 refills | Status: DC

## 2017-05-24 NOTE — Patient Instructions (Addendum)
1. Schedule MRI brain without contrast  We have sent a referral to [facility] for your MRI and they will call you directly to schedule your appt. They are located at [place]. If you need to contact them directly please call [phone].  2. Bloodwork for B12, RPR  Your provider has requested that you have labwork completed today. Please go to [facility] ([id number]) on the second floor of this building before leaving the office today. You do not need to check in. If you are not called within 15 minutes please check with the front desk.   3. Start Namenda 5mg : Take 1 tablet daily for 1 month, then increase to 1 tablet daily 4. Recommend to significantly cut down on driving to only local daytime driving, and if any further issues, stop driving 5. Follow-up in 6 months, call for any changes
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
## 2017-05-25 LAB — RPR

## 2017-05-25 LAB — VITAMIN B12: Vitamin B-12: 366 pg/mL (ref 200–1100)

## 2017-05-27 ENCOUNTER — Telehealth: Payer: Self-pay

## 2017-05-27 NOTE — Telephone Encounter (Signed)
-----   Message from [name], MD sent at 05/27/2017 10:06 AM EDT ----- Pls let him know bloodwork is normal, thanks

## 2017-05-27 NOTE — Telephone Encounter (Signed)
LMOM relaying message below.  

## 2017-06-05 NOTE — Progress Notes (Deleted)
Cardiology Office Note   Date:  06/05/2017   ID:  KHRISTIAN PHILLIPPI, DOB 1934/10/27, MRN [id number]  PCP:  Vernie Shanks, MD  Cardiologist:  Dr. Tamala Julian    No chief complaint on file.     History of Present Illness: Gavin Campbell is a 82 y.o. male who presents for ***  history of HTN, HLD, non obstructive CAD who presents to clinic for new patient evaluation of palpitations.   He has a history of an abnormal nuclear stress test in 2005 and exertional chest burning with subsequent cath in 2006 that showed widely patent coronary arteries with 25% oLAD stenosis.   Labs reviewed from 10/2016: Creat 1.11, K 4.4, AST/ALT 20/16, TC 129, TG 82, HDL 43, LDL 70.   Monitor showed normal heart rhythm, but his natural HR is on the slower side. Average HR 59. Nothing to explain his palpitations  Past Medical History:  Diagnosis Date  . Blurred vision   . Chest pain 2007   ABNORMAL CARDIOLITE   . Coronary arteriosclerosis in native artery   . Diplopia   . Early satiety   . Hyperlipidemia   . Hypertension   . Left ventricular systolic dysfunction   . Orthostatic dizziness   . Parotid mass   . Pure hypercholesterolemia   . Unsteady gait   . Venous stasis dermatitis of right lower extremity     Past Surgical History:  Procedure Laterality Date  . CORONARY ANGIOPLASTY  2009   NON OBSTRUCTIVE DISEASE     Current Outpatient Prescriptions  Medication Sig Dispense Refill  . amLODipine (NORVASC) 2.5 MG tablet Take 2.5 mg by mouth daily.    Marland Kitchen aspirin 81 MG tablet Take 81 mg by mouth daily.    . Coenzyme Q10 10 MG capsule Take 10 mg by mouth daily.    Marland Kitchen desoximetasone (TOPICORT) 0.25 % cream MASSAGE ON RASH D  5  . memantine (NAMENDA) 5 MG tablet Take 1 tablet daily for 1 month, then increase to 1 tablet twice a day 60 tablet 11  . metoprolol succinate (TOPROL-XL) 25 MG 24 hr tablet Take 25 mg by mouth daily.    . ranitidine (ZANTAC) 150 MG tablet Take 150 mg by mouth at bedtime.     . simvastatin (ZOCOR) 20 MG tablet Take 20 mg by mouth daily.    . TRIAMCINOLONE ACETONIDE EX Apply topically 2 (two) times daily.     No current facility-administered medications for this visit.     Allergies:   Patient has no known allergies.    Social History:  The patient  reports that he quit smoking about 48 years ago. He has never used smokeless tobacco. He reports that he does not drink alcohol or use drugs.   Family History:  The patient's ***family history includes Dementia in his mother; Healthy in his father and mother.    ROS:  General:no colds or fevers, no weight changes Skin:no rashes or ulcers HEENT:no blurred vision, no congestion CV:see HPI PUL:see HPI GI:no diarrhea constipation or melena, no indigestion GU:no hematuria, no dysuria MS:no joint pain, no claudication Neuro:no syncope, no lightheadedness Endo:no diabetes, no thyroid disease Wt Readings from Last 3 Encounters:  05/24/17 154 lb (69.9 kg)  04/04/17 153 lb 12.8 oz (69.8 kg)  03/16/16 157 lb (71.2 kg)     PHYSICAL EXAM: VS:  There were no vitals taken for this visit. , BMI There is no height or weight on file to calculate BMI. General:Pleasant affect,  NAD Skin:Warm and dry, brisk capillary refill HEENT:normocephalic, sclera clear, mucus membranes moist Neck:supple, no JVD, no bruits  Heart:S1S2 RRR without murmur, gallup, rub or click Lungs:clear without rales, rhonchi, or wheezes JWL:KHVF, non tender, + BS, do not palpate liver spleen or masses Ext:no lower ext edema, 2+ pedal pulses, 2+ radial pulses Neuro:alert and oriented, MAE, follows commands, + facial symmetry    EKG:  EKG is ordered today. The ekg ordered today demonstrates ***   Recent Labs: 04/04/2017: BUN 19; Creatinine, Ser 1.19; Potassium 4.4; Sodium 142; TSH 1.520    Lipid Panel No results found for: CHOL, TRIG, HDL, CHOLHDL, VLDL, LDLCALC, LDLDIRECT     Other studies Reviewed: Additional studies/ records that were  reviewed today include: ***.   ASSESSMENT AND PLAN:  1.  ***   Current medicines are reviewed with the patient today.  The patient Has no concerns regarding medicines.  The following changes have been made:  See above Labs/ tests ordered today include:see above  Disposition:   FU:  see above  Signed, Cecilie Kicks, NP  06/05/2017 9:53 PM    [facility] [place], [place], [place], [place] Phone: [phone]; Fax: [phone]

## 2017-06-06 ENCOUNTER — Ambulatory Visit: Payer: Medicare PPO | Admitting: Cardiology

## 2017-06-09 ENCOUNTER — Ambulatory Visit
Admission: RE | Admit: 2017-06-09 | Discharge: 2017-06-09 | Disposition: A | Discharge status: Home / Self Care | Payer: Medicare PPO | Source: Ambulatory Visit | Attending: Neurology | Admitting: Neurology

## 2017-06-09 DIAGNOSIS — F0390 Unspecified dementia without behavioral disturbance: Secondary | ICD-10-CM

## 2017-06-09 DIAGNOSIS — F03A0 Unspecified dementia, mild, without behavioral disturbance, psychotic disturbance, mood disturbance, and anxiety: Secondary | ICD-10-CM

## 2017-06-09 DIAGNOSIS — R410 Disorientation, unspecified: Secondary | ICD-10-CM | POA: Diagnosis not present

## 2017-06-13 ENCOUNTER — Telehealth: Payer: Self-pay

## 2017-06-13 DIAGNOSIS — F419 Anxiety disorder, unspecified: Secondary | ICD-10-CM | POA: Diagnosis not present

## 2017-06-13 DIAGNOSIS — Z23 Encounter for immunization: Secondary | ICD-10-CM | POA: Diagnosis not present

## 2017-06-13 DIAGNOSIS — H532 Diplopia: Secondary | ICD-10-CM | POA: Diagnosis not present

## 2017-06-13 DIAGNOSIS — G3184 Mild cognitive impairment, so stated: Secondary | ICD-10-CM | POA: Diagnosis not present

## 2017-06-13 DIAGNOSIS — Z0000 Encounter for general adult medical examination without abnormal findings: Secondary | ICD-10-CM | POA: Diagnosis not present

## 2017-06-13 DIAGNOSIS — E7800 Pure hypercholesterolemia, unspecified: Secondary | ICD-10-CM | POA: Diagnosis not present

## 2017-06-13 DIAGNOSIS — I10 Essential (primary) hypertension: Secondary | ICD-10-CM | POA: Diagnosis not present

## 2017-06-13 NOTE — Telephone Encounter (Signed)
-----   Message from [name], MD sent at 06/11/2017  1:52 PM EDT ----- Pls let him know MRI brain looks good, no evidence of tumor, stroke, or bleed. It shows age-related changes. Thanks

## 2017-06-13 NOTE — Telephone Encounter (Signed)
LMOM relaying message below.  

## 2017-07-10 DIAGNOSIS — H532 Diplopia: Secondary | ICD-10-CM | POA: Diagnosis not present

## 2017-07-15 ENCOUNTER — Encounter: Payer: Self-pay | Admitting: Nurse Practitioner

## 2017-07-15 ENCOUNTER — Ambulatory Visit (INDEPENDENT_AMBULATORY_CARE_PROVIDER_SITE_OTHER): Payer: Medicare PPO | Admitting: Nurse Practitioner

## 2017-07-15 ENCOUNTER — Encounter (INDEPENDENT_AMBULATORY_CARE_PROVIDER_SITE_OTHER): Payer: Self-pay

## 2017-07-15 VITALS — BP 110/70 | HR 73 | Ht 71.0 in | Wt 152.4 lb

## 2017-07-15 DIAGNOSIS — E785 Hyperlipidemia, unspecified: Secondary | ICD-10-CM | POA: Diagnosis not present

## 2017-07-15 DIAGNOSIS — R002 Palpitations: Secondary | ICD-10-CM

## 2017-07-15 NOTE — Progress Notes (Signed)
CARDIOLOGY OFFICE NOTE  Date:  07/15/2017    Arcelia Jew Date of Birth: 08/24/1935 Medical Record #[id number]  PCP:  Vernie Shanks, MD  Cardiologist:  Tamala Julian    Chief Complaint  Patient presents with  . Coronary Artery Disease    Follow up visit. Seen for Dr. Tamala Julian    History of Present Illness: SYLUS STGERMAIN is a 82 y.o. male who presents today for a 4 month check. Seen for Dr. Tamala Julian.   He has a history of HTN, HLD, & non obstructive CAD. He has a history of an abnormal nuclear stress test in 2005 and exertional chest burning with subsequent cath in 2006 that showed widely patent coronary arteries with 25% oLAD stenosis.   Last seen here back in June by Bonney Leitz, PA for follow up of palpitations. History difficult to follow.   Comes in today. Here alone. He feels like he is doing ok. No chest pain. Breathing is ok. Says palpitations are better - he is not sure why. He admits he is "nervous" - he is not sure why. Sounds like he ran out of Namenda - can't get refilled yet. Gets out of the house about every day. He still works as a Art gallery manager.   Past Medical History:  Diagnosis Date  . Blurred vision   . Chest pain 2007   ABNORMAL CARDIOLITE   . Coronary arteriosclerosis in native artery   . Diplopia   . Early satiety   . Hyperlipidemia   . Hypertension   . Left ventricular systolic dysfunction   . Orthostatic dizziness   . Parotid mass   . Pure hypercholesterolemia   . Unsteady gait   . Venous stasis dermatitis of right lower extremity     Past Surgical History:  Procedure Laterality Date  . CORONARY ANGIOPLASTY  2009   NON OBSTRUCTIVE DISEASE     Medications: Current Meds  Medication Sig  . amLODipine (NORVASC) 2.5 MG tablet Take 2.5 mg by mouth daily.  Marland Kitchen aspirin 81 MG tablet Take 81 mg by mouth daily.  . Coenzyme Q10 10 MG capsule Take 10 mg by mouth daily.  Marland Kitchen desoximetasone (TOPICORT) 0.25 % cream MASSAGE ON RASH D  . memantine (NAMENDA) 5 MG  tablet Take 1 tablet daily for 1 month, then increase to 1 tablet twice a day  . metoprolol succinate (TOPROL-XL) 25 MG 24 hr tablet Take 25 mg by mouth daily.  . ranitidine (ZANTAC) 150 MG tablet Take 150 mg by mouth at bedtime.  . simvastatin (ZOCOR) 20 MG tablet Take 20 mg by mouth daily.  . TRIAMCINOLONE ACETONIDE EX Apply topically 2 (two) times daily.     Allergies: No Known Allergies  Social History: The patient  reports that he quit smoking about 48 years ago. He has never used smokeless tobacco. He reports that he does not drink alcohol or use drugs.   Family History: The patient's family history includes Dementia in his mother; Healthy in his father and mother.   Review of Systems: Please see the history of present illness.   Otherwise, the review of systems is positive for none.   All other systems are reviewed and negative.   Physical Exam: VS:  BP 110/70 (BP Location: Left Arm, Patient Position: Sitting, Cuff Size: Normal)   Pulse 73   Ht 5\' 11"  (1.803 m)   Wt 152 lb 6.4 oz (69.1 kg)   SpO2 97% Comment: at rest  BMI 21.26 kg/m  .  BMI Body mass index is 21.26 kg/m.  Wt Readings from Last 3 Encounters:  07/15/17 152 lb 6.4 oz (69.1 kg)  05/24/17 154 lb (69.9 kg)  04/04/17 153 lb 12.8 oz (69.8 kg)   BP is 110/60 by me General: Pleasant. Elderly male - he is alert and in no acute distress.   HEENT: Normal.  Neck: Supple, no JVD, carotid bruits, or masses noted.  Cardiac: Regular rate and rhythm. No murmurs, rubs, or gallops. No edema.  Respiratory:  Lungs are clear to auscultation bilaterally with normal work of breathing.  GI: Soft and nontender.  MS: No deformity or atrophy. Gait and ROM intact.  Skin: Warm and dry. Color is normal.  Neuro:  Strength and sensation are intact and no gross focal deficits noted.  Psych: Alert, appropriate and with normal affect.   LABORATORY DATA:  EKG:  EKG is not ordered today.  Lab Results  Component Value Date   WBC  6.0 03/16/2016   HGB 13.5 03/16/2016   HCT 42.1 03/16/2016   PLT 157 03/16/2016   GLUCOSE 87 04/04/2017   NA 142 04/04/2017   K 4.4 04/04/2017   CL 105 04/04/2017   CREATININE 1.19 04/04/2017   BUN 19 04/04/2017   CO2 20 04/04/2017   TSH 1.520 04/04/2017   INR 1.08 03/16/2016       BNP (last 3 results) No results for input(s): BNP in the last 8760 hours.  ProBNP (last 3 results) No results for input(s): PROBNP in the last 8760 hours.   Other Studies Reviewed Today:  Event Monitor Study Highlights 03/2017    NSR and sinus bradycardia   Normal study with predominant sinus bradycardia.     Cath 2009 CONCLUSION: 1. Normal left ventricular function. 2. Widely patent coronaries. The patient does have a very eccentric, focal, ostial LAD lesion that obstructs the vessel by less than 25%. For the most part, otherwise the vessels are widely patent with only minimal luminal irregularity. There is significant tortuosity in the right coronary and the circumflex. 3. Exertional chest burning of uncertain etiology, not felt to be ischemic in origin. Possibilities include COPD versus other.  PLAN: 1. Continue aspirin. 2. Continue antihypertensive therapy. 3. Continue low-dose statin therapy for the ostial LAD plaque.   Assessment/Plan:  1. Palpitations: Resolved  2. HTN: BP is fine on his current regimen. No changes made today.   3. HLD: continue statin - most recent lipids noted.   4. Nonobstructive CAD - no active symptoms - favor continued medical management.   5. Memory issues/nervousness - may be from being out of his Namenda - to discuss refill with PCP.   Current medicines are reviewed with the patient today.  The patient does not have concerns regarding medicines other than what has been noted above.  The following changes have been made:  See above.  Labs/ tests ordered today include:   No orders of the defined  types were placed in this encounter.    Disposition:   FU with Dr. Tamala Julian in one year.   Patient is agreeable to this plan and will call if any problems develop in the interim.   SignedTruitt Merle, NP  07/15/2017 9:48 AM  [facility] [address] [place], [place]  [address] Phone: [phone] Fax: [phone]

## 2017-07-15 NOTE — Patient Instructions (Addendum)
We will be checking the following labs today - NONE   Medication Instructions:    Continue with your current medicines. AND  Call Dr. Jacelyn Grip today about refilling your Namenda    Testing/Procedures To Be Arranged:  N/A  Follow-Up:   See Dr. Tamala Julian in one year    Other Special Instructions:   N/A    If you need a refill on your cardiac medications before your next appointment, please call your pharmacy.   Call the [place] office at [phone] if you have any questions, problems or concerns.

## 2017-09-03 ENCOUNTER — Ambulatory Visit: Payer: Medicare PPO | Admitting: Sports Medicine

## 2017-09-03 ENCOUNTER — Encounter: Payer: Self-pay | Admitting: Sports Medicine

## 2017-09-03 DIAGNOSIS — M79675 Pain in left toe(s): Secondary | ICD-10-CM

## 2017-09-03 DIAGNOSIS — B351 Tinea unguium: Secondary | ICD-10-CM | POA: Diagnosis not present

## 2017-09-03 DIAGNOSIS — M79674 Pain in right toe(s): Secondary | ICD-10-CM | POA: Diagnosis not present

## 2017-09-03 NOTE — Progress Notes (Signed)
Patient ID: Gavin Campbell, male   DOB: 1935-01-18, 82 y.o.   MRN: [id number] Subjective: Gavin Campbell is a 82 y.o. male patient seen today in office with complaint of painful thickened and elongated toenails; unable to trim. Patient denies any changes to medical history. Patient has no other pedal complaints at this time.   Patient Active Problem List   Diagnosis Date Noted  . Mild dementia 05/24/2017  . Chest pain 04/03/2017  . Mass of parotid gland 11/29/2015    Current Outpatient Medications on File Prior to Visit  Medication Sig Dispense Refill  . amLODipine (NORVASC) 2.5 MG tablet Take 2.5 mg by mouth daily.    Marland Kitchen aspirin 81 MG tablet Take 81 mg by mouth daily.    . Coenzyme Q10 10 MG capsule Take 10 mg by mouth daily.    Marland Kitchen desoximetasone (TOPICORT) 0.25 % cream MASSAGE ON RASH D  5  . memantine (NAMENDA) 5 MG tablet Take 1 tablet daily for 1 month, then increase to 1 tablet twice a day 60 tablet 11  . metoprolol succinate (TOPROL-XL) 25 MG 24 hr tablet Take 25 mg by mouth daily.    . ranitidine (ZANTAC) 150 MG tablet Take 150 mg by mouth at bedtime.    . simvastatin (ZOCOR) 20 MG tablet Take 20 mg by mouth daily.    . TRIAMCINOLONE ACETONIDE EX Apply topically 2 (two) times daily.     No current facility-administered medications on file prior to visit.     No Known Allergies  Objective: Physical Exam  General: Well developed, nourished, no acute distress, awake, alert and oriented x 3  Vascular: Dorsalis pedis artery 2/4 bilateral, Posterior tibial artery 1/4 bilateral, skin temperature warm to warm proximal to distal bilateral lower extremities, no varicosities, pedal hair present bilateral.  Neurological: Gross sensation present via light touch bilateral.   Dermatological: Skin is warm, dry, and supple bilateral, Nails 1-10 are tender, long, thick, and discolored with mild subungal debris, no webspace macerations present bilateral, no open lesions present bilateral, no  callus/corns/hyperkeratotic tissue present bilateral. No signs of infection bilateral.  Musculoskeletal: Bunion and hammertoe deformities noted bilateral. Muscular strength within normal limits without painon range of motion. No pain with calf compression bilateral.  Assessment and Plan:  Problem List Items Addressed This Visit    None    Visit Diagnoses    Dermatophytosis of nail    -  Primary   Toe pain, bilateral          -Examined patient.  -Discussed treatment options for painful mycotic nails. -Mechanically debrided and reduced mycotic nails with sterile nail nipper and dremel nail file without incident. -Recommend good support shoes for foot type -Patient to return in 3 months for follow up evaluation or sooner if symptoms worsen.  Landis Martins, DPM

## 2017-10-21 DIAGNOSIS — F419 Anxiety disorder, unspecified: Secondary | ICD-10-CM | POA: Diagnosis not present

## 2017-10-21 DIAGNOSIS — I10 Essential (primary) hypertension: Secondary | ICD-10-CM | POA: Diagnosis not present

## 2017-10-21 DIAGNOSIS — G3184 Mild cognitive impairment, so stated: Secondary | ICD-10-CM | POA: Diagnosis not present

## 2017-10-21 DIAGNOSIS — E7800 Pure hypercholesterolemia, unspecified: Secondary | ICD-10-CM | POA: Diagnosis not present

## 2017-10-21 DIAGNOSIS — H532 Diplopia: Secondary | ICD-10-CM | POA: Diagnosis not present

## 2017-11-29 ENCOUNTER — Encounter: Payer: Self-pay | Admitting: Neurology

## 2017-11-29 ENCOUNTER — Ambulatory Visit: Payer: Medicare PPO | Admitting: Neurology

## 2017-11-29 ENCOUNTER — Other Ambulatory Visit: Payer: Self-pay

## 2017-11-29 VITALS — BP 110/72 | HR 75 | Ht 71.0 in | Wt 154.0 lb

## 2017-11-29 DIAGNOSIS — F03A0 Unspecified dementia, mild, without behavioral disturbance, psychotic disturbance, mood disturbance, and anxiety: Secondary | ICD-10-CM

## 2017-11-29 DIAGNOSIS — G20 Parkinson's disease: Secondary | ICD-10-CM | POA: Diagnosis not present

## 2017-11-29 DIAGNOSIS — F0390 Unspecified dementia without behavioral disturbance: Secondary | ICD-10-CM

## 2017-11-29 MED ORDER — MEMANTINE HCL 5 MG PO TABS: ORAL_TABLET | ORAL | 3 refills | Status: DC

## 2017-11-29 NOTE — Patient Instructions (Signed)
1. Continue Namenda 5mg  twice a day 2. Please bring your wife to come on your next appointment 3. No driving long distances, if having difficulties driving the short distance to work, would stop driving 4. Follow-up in 6 months, call for any changes  FALL PRECAUTIONS: Be cautious when walking. Scan the area for obstacles that may increase the risk of trips and falls. When getting up in the mornings, sit up at the edge of the bed for a few minutes before getting out of bed. Consider elevating the bed at the head end to avoid drop of blood pressure when getting up. Walk always in a well-lit room (use night lights in the walls). Avoid area rugs or power cords from appliances in the middle of the walkways. Use a walker or a cane if necessary and consider physical therapy for balance exercise. Get your eyesight checked regularly.  FINANCIAL OVERSIGHT: Supervision, especially oversight when making financial decisions or transactions is also recommended.  HOME SAFETY: Consider the safety of the kitchen when operating appliances like stoves, microwave oven, and blender. Consider having supervision and share cooking responsibilities until no longer able to participate in those. Accidents with firearms and other hazards in the house should be identified and addressed as well.  DRIVING: Regarding driving, in patients with progressive memory problems, driving will be impaired. We advise to have someone else do the driving if trouble finding directions or if minor accidents are reported. Independent driving assessment is available to determine safety of driving.  ABILITY TO BE LEFT ALONE: If patient is unable to contact 911 operator, consider using LifeLine, or when the need is there, arrange for someone to stay with patients. Smoking is a fire hazard, consider supervision or cessation. Risk of wandering should be assessed by caregiver and if detected at any point, supervision and safe proof recommendations should  be instituted.  MEDICATION SUPERVISION: Inability to self-administer medication needs to be constantly addressed. Implement a mechanism to ensure safe administration of the medications.  RECOMMENDATIONS FOR ALL PATIENTS WITH MEMORY PROBLEMS: 1. Continue to exercise (Recommend 30 minutes of walking everyday, or 3 hours every week) 2. Increase social interactions - continue going to [place] and enjoy social gatherings with friends and family 3. Eat healthy, avoid fried foods and eat more fruits and vegetables 4. Maintain adequate blood pressure, blood sugar, and blood cholesterol level. Reducing the risk of stroke and cardiovascular disease also helps promoting better memory. 5. Avoid stressful situations. Live a simple life and avoid aggravations. Organize your time and prepare for the next day in anticipation. 6. Sleep well, avoid any interruptions of sleep and avoid any distractions in the bedroom that may interfere with adequate sleep quality 7. Avoid sugar, avoid sweets as there is a strong link between excessive sugar intake, diabetes, and cognitive impairment The Mediterranean diet has been shown to help patients reduce the risk of progressive memory disorders and reduces cardiovascular risk. This includes eating fish, eat fruits and green leafy vegetables, nuts like almonds and hazelnuts, walnuts, and also use olive oil. Avoid fast foods and fried foods as much as possible. Avoid sweets and sugar as sugar use has been linked to worsening of memory function.  There is always a concern of gradual progression of memory problems. If this is the case, then we may need to adjust level of care according to patient needs. Support, both to the patient and caregiver, should then be put into place.
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
## 2018-02-11 ENCOUNTER — Ambulatory Visit: Payer: Medicare PPO | Admitting: Sports Medicine

## 2018-05-13 ENCOUNTER — Encounter: Payer: Self-pay | Admitting: Sports Medicine

## 2018-05-13 ENCOUNTER — Ambulatory Visit: Payer: Medicare PPO | Admitting: Sports Medicine

## 2018-05-13 DIAGNOSIS — M79675 Pain in left toe(s): Secondary | ICD-10-CM | POA: Diagnosis not present

## 2018-05-13 DIAGNOSIS — F0390 Unspecified dementia without behavioral disturbance: Secondary | ICD-10-CM

## 2018-05-13 DIAGNOSIS — F03A0 Unspecified dementia, mild, without behavioral disturbance, psychotic disturbance, mood disturbance, and anxiety: Secondary | ICD-10-CM

## 2018-05-13 DIAGNOSIS — M79674 Pain in right toe(s): Secondary | ICD-10-CM | POA: Diagnosis not present

## 2018-05-13 DIAGNOSIS — B351 Tinea unguium: Secondary | ICD-10-CM

## 2018-05-13 NOTE — Progress Notes (Signed)
Patient ID: Gavin Campbell, male   DOB: June 27, 1935, 83 y.o.   MRN: [id number] Subjective: Gavin Campbell is a 83 y.o. male patient seen today in office with complaint of painful thickened and elongated toenails; unable to trim. Patient denies any changes to medical history. Patient has no other pedal complaints at this time.   Patient Active Problem List   Diagnosis Date Noted  . Mild dementia 05/24/2017  . Chest pain 04/03/2017  . Mass of parotid gland 11/29/2015    Current Outpatient Medications on File Prior to Visit  Medication Sig Dispense Refill  . amLODipine (NORVASC) 2.5 MG tablet Take 2.5 mg by mouth daily.    Marland Kitchen aspirin 81 MG tablet Take 81 mg by mouth daily.    . Coenzyme Q10 10 MG capsule Take 10 mg by mouth daily.    Marland Kitchen desoximetasone (TOPICORT) 0.25 % cream MASSAGE ON RASH D  5  . memantine (NAMENDA) 5 MG tablet Take 1 tablet twice a day 180 tablet 3  . metoprolol succinate (TOPROL-XL) 25 MG 24 hr tablet Take 25 mg by mouth daily.    . ranitidine (ZANTAC) 150 MG tablet Take 150 mg by mouth at bedtime.    . simvastatin (ZOCOR) 20 MG tablet Take 20 mg by mouth daily.    . TRIAMCINOLONE ACETONIDE EX Apply topically 2 (two) times daily.     No current facility-administered medications on file prior to visit.     No Known Allergies  Objective: Physical Exam  General: Well developed, nourished, no acute distress, awake, alert and oriented x 3  Vascular: Dorsalis pedis artery 2/4 bilateral, Posterior tibial artery 1/4 bilateral, skin temperature warm to warm proximal to distal bilateral lower extremities, no varicosities, pedal hair present bilateral.  Neurological: Gross sensation present via light touch bilateral.   Dermatological: Skin is warm, dry, and supple bilateral, Nails 1-10 are tender, long, thick, and discolored with mild subungal debris, no webspace macerations present bilateral, no open lesions present bilateral, no callus/corns/hyperkeratotic tissue present  bilateral. No signs of infection bilateral.  Musculoskeletal: Bunion and hammertoe deformities noted bilateral. Muscular strength within normal limits without painon range of motion. No pain with calf compression bilateral.  Assessment and Plan:  Problem List Items Addressed This Visit      Nervous and Auditory   Mild dementia    Other Visit Diagnoses    Dermatophytosis of nail    -  Primary   Toe pain, bilateral          -Examined patient.  -Discussed treatment options for painful mycotic nails. -Mechanically debrided and reduced mycotic nails with sterile nail nipper and dremel nail file without incident. -Recommend good support shoes for foot type -Patient to return in 3 months for follow up evaluation or sooner if symptoms worsen.  Landis Martins, DPM

## 2018-05-15 DIAGNOSIS — I2510 Atherosclerotic heart disease of native coronary artery without angina pectoris: Secondary | ICD-10-CM | POA: Diagnosis not present

## 2018-05-15 DIAGNOSIS — R42 Dizziness and giddiness: Secondary | ICD-10-CM | POA: Diagnosis not present

## 2018-05-15 DIAGNOSIS — R531 Weakness: Secondary | ICD-10-CM | POA: Diagnosis not present

## 2018-05-15 DIAGNOSIS — M542 Cervicalgia: Secondary | ICD-10-CM | POA: Diagnosis not present

## 2018-05-15 DIAGNOSIS — H532 Diplopia: Secondary | ICD-10-CM | POA: Diagnosis not present

## 2018-05-15 DIAGNOSIS — K119 Disease of salivary gland, unspecified: Secondary | ICD-10-CM | POA: Diagnosis not present

## 2018-05-15 DIAGNOSIS — I10 Essential (primary) hypertension: Secondary | ICD-10-CM | POA: Diagnosis not present

## 2018-05-15 DIAGNOSIS — E7800 Pure hypercholesterolemia, unspecified: Secondary | ICD-10-CM | POA: Diagnosis not present

## 2018-05-15 DIAGNOSIS — F0390 Unspecified dementia without behavioral disturbance: Secondary | ICD-10-CM | POA: Diagnosis not present

## 2018-05-16 DIAGNOSIS — Z87891 Personal history of nicotine dependence: Secondary | ICD-10-CM | POA: Diagnosis not present

## 2018-05-16 DIAGNOSIS — Z86018 Personal history of other benign neoplasm: Secondary | ICD-10-CM | POA: Diagnosis not present

## 2018-05-16 DIAGNOSIS — J383 Other diseases of vocal cords: Secondary | ICD-10-CM | POA: Diagnosis not present

## 2018-05-22 DIAGNOSIS — R42 Dizziness and giddiness: Secondary | ICD-10-CM | POA: Diagnosis not present

## 2018-05-22 DIAGNOSIS — I2510 Atherosclerotic heart disease of native coronary artery without angina pectoris: Secondary | ICD-10-CM | POA: Diagnosis not present

## 2018-05-22 DIAGNOSIS — R531 Weakness: Secondary | ICD-10-CM | POA: Diagnosis not present

## 2018-05-22 DIAGNOSIS — I10 Essential (primary) hypertension: Secondary | ICD-10-CM | POA: Diagnosis not present

## 2018-05-22 DIAGNOSIS — E7800 Pure hypercholesterolemia, unspecified: Secondary | ICD-10-CM | POA: Diagnosis not present

## 2018-06-05 DIAGNOSIS — G20 Parkinson's disease: Secondary | ICD-10-CM | POA: Diagnosis not present

## 2018-06-05 DIAGNOSIS — R531 Weakness: Secondary | ICD-10-CM | POA: Diagnosis not present

## 2018-06-05 DIAGNOSIS — R42 Dizziness and giddiness: Secondary | ICD-10-CM | POA: Diagnosis not present

## 2018-06-05 DIAGNOSIS — I2510 Atherosclerotic heart disease of native coronary artery without angina pectoris: Secondary | ICD-10-CM | POA: Diagnosis not present

## 2018-06-05 DIAGNOSIS — I10 Essential (primary) hypertension: Secondary | ICD-10-CM | POA: Diagnosis not present

## 2018-06-05 DIAGNOSIS — E7800 Pure hypercholesterolemia, unspecified: Secondary | ICD-10-CM | POA: Diagnosis not present

## 2018-06-10 ENCOUNTER — Encounter: Payer: Self-pay | Admitting: Neurology

## 2018-06-10 ENCOUNTER — Ambulatory Visit (INDEPENDENT_AMBULATORY_CARE_PROVIDER_SITE_OTHER): Payer: Medicare PPO | Admitting: Neurology

## 2018-06-10 ENCOUNTER — Other Ambulatory Visit: Payer: Self-pay

## 2018-06-10 VITALS — BP 114/68 | HR 59 | Ht 71.0 in | Wt 146.0 lb

## 2018-06-10 DIAGNOSIS — F0390 Unspecified dementia without behavioral disturbance: Secondary | ICD-10-CM

## 2018-06-10 DIAGNOSIS — R292 Abnormal reflex: Secondary | ICD-10-CM | POA: Diagnosis not present

## 2018-06-10 DIAGNOSIS — R29898 Other symptoms and signs involving the musculoskeletal system: Secondary | ICD-10-CM

## 2018-06-10 DIAGNOSIS — G20 Parkinson's disease: Secondary | ICD-10-CM | POA: Diagnosis not present

## 2018-06-10 DIAGNOSIS — F03A0 Unspecified dementia, mild, without behavioral disturbance, psychotic disturbance, mood disturbance, and anxiety: Secondary | ICD-10-CM

## 2018-06-10 MED ORDER — CARBIDOPA-LEVODOPA 25-100 MG PO TABS: ORAL_TABLET | ORAL | 11 refills | Status: DC
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
## 2018-06-10 NOTE — Patient Instructions (Addendum)
1. Records from Dr. Jacelyn Grip will be requested for review, if needed we may do more imaging of the neck 2. Start Sinemet 25/100mg : Take 1/2 tablet three times a day with meals 3. Start [facility] or [facility] for exercise at least 3 times a week 4. Follow-up in 6 months or so, call for any changes  FALL PRECAUTIONS: Be cautious when walking. Scan the area for obstacles that may increase the risk of trips and falls. When getting up in the mornings, sit up at the edge of the bed for a few minutes before getting out of bed. Consider elevating the bed at the head end to avoid drop of blood pressure when getting up. Walk always in a well-lit room (use night lights in the walls). Avoid area rugs or power cords from appliances in the middle of the walkways. Use a walker or a cane if necessary and consider physical therapy for balance exercise. Get your eyesight checked regularly.  HOME SAFETY: Consider the safety of the kitchen when operating appliances like stoves, microwave oven, and blender. Consider having supervision and share cooking responsibilities until no longer able to participate in those. Accidents with firearms and other hazards in the house should be identified and addressed as well.  DRIVING: Regarding driving, in patients with progressive memory problems, driving will be impaired. We advise to have someone else do the driving if trouble finding directions or if minor accidents are reported. Independent driving assessment is available to determine safety of driving.  ABILITY TO BE LEFT ALONE: If patient is unable to contact 911 operator, consider using LifeLine, or when the need is there, arrange for someone to stay with patients. Smoking is a fire hazard, consider supervision or cessation. Risk of wandering should be assessed by caregiver and if detected at any point, supervision and safe proof recommendations should be instituted.  MEDICATION SUPERVISION: Inability to self-administer  medication needs to be constantly addressed. Implement a mechanism to ensure safe administration of the medications.  RECOMMENDATIONS FOR ALL PATIENTS WITH MEMORY PROBLEMS: 1. Continue to exercise (Recommend 30 minutes of walking everyday, or 3 hours every week) 2. Increase social interactions - continue going to [place] and enjoy social gatherings with friends and family 3. Eat healthy, avoid fried foods and eat more fruits and vegetables 4. Maintain adequate blood pressure, blood sugar, and blood cholesterol level. Reducing the risk of stroke and cardiovascular disease also helps promoting better memory. 5. Avoid stressful situations. Live a simple life and avoid aggravations. Organize your time and prepare for the next day in anticipation. 6. Sleep well, avoid any interruptions of sleep and avoid any distractions in the bedroom that may interfere with adequate sleep quality 7. Avoid sugar, avoid sweets as there is a strong link between excessive sugar intake, diabetes, and cognitive impairment The Mediterranean diet has been shown to help patients reduce the risk of progressive memory disorders and reduces cardiovascular risk. This includes eating fish, eat fruits and green leafy vegetables, nuts like almonds and hazelnuts, walnuts, and also use olive oil. Avoid fast foods and fried foods as much as possible. Avoid sweets and sugar as sugar use has been linked to worsening of memory function.  There is always a concern of gradual progression of memory problems. If this is the case, then we may need to adjust level of care according to patient needs. Support, both to the patient and caregiver, should then be put into place.

## 2018-06-18 DIAGNOSIS — H02831 Dermatochalasis of right upper eyelid: Secondary | ICD-10-CM | POA: Diagnosis not present

## 2018-06-18 DIAGNOSIS — H02834 Dermatochalasis of left upper eyelid: Secondary | ICD-10-CM | POA: Diagnosis not present

## 2018-06-18 DIAGNOSIS — H2512 Age-related nuclear cataract, left eye: Secondary | ICD-10-CM | POA: Diagnosis not present

## 2018-06-23 DIAGNOSIS — Z0000 Encounter for general adult medical examination without abnormal findings: Secondary | ICD-10-CM | POA: Diagnosis not present

## 2018-06-23 DIAGNOSIS — G20 Parkinson's disease: Secondary | ICD-10-CM | POA: Diagnosis not present

## 2018-06-23 DIAGNOSIS — M4012 Other secondary kyphosis, cervical region: Secondary | ICD-10-CM | POA: Diagnosis not present

## 2018-06-23 DIAGNOSIS — F322 Major depressive disorder, single episode, severe without psychotic features: Secondary | ICD-10-CM | POA: Diagnosis not present

## 2018-06-23 DIAGNOSIS — F0390 Unspecified dementia without behavioral disturbance: Secondary | ICD-10-CM | POA: Diagnosis not present

## 2018-06-23 DIAGNOSIS — Z23 Encounter for immunization: Secondary | ICD-10-CM | POA: Diagnosis not present

## 2018-06-23 DIAGNOSIS — F0280 Dementia in other diseases classified elsewhere without behavioral disturbance: Secondary | ICD-10-CM | POA: Diagnosis not present

## 2018-06-23 DIAGNOSIS — N401 Enlarged prostate with lower urinary tract symptoms: Secondary | ICD-10-CM | POA: Diagnosis not present

## 2018-06-23 DIAGNOSIS — I2510 Atherosclerotic heart disease of native coronary artery without angina pectoris: Secondary | ICD-10-CM | POA: Diagnosis not present

## 2018-06-23 DIAGNOSIS — E7800 Pure hypercholesterolemia, unspecified: Secondary | ICD-10-CM | POA: Diagnosis not present

## 2018-06-27 DIAGNOSIS — F0390 Unspecified dementia without behavioral disturbance: Secondary | ICD-10-CM | POA: Diagnosis not present

## 2018-06-27 DIAGNOSIS — G20 Parkinson's disease: Secondary | ICD-10-CM | POA: Diagnosis not present

## 2018-06-27 DIAGNOSIS — N401 Enlarged prostate with lower urinary tract symptoms: Secondary | ICD-10-CM | POA: Diagnosis not present

## 2018-06-27 DIAGNOSIS — F0280 Dementia in other diseases classified elsewhere without behavioral disturbance: Secondary | ICD-10-CM | POA: Diagnosis not present

## 2018-06-27 DIAGNOSIS — I2510 Atherosclerotic heart disease of native coronary artery without angina pectoris: Secondary | ICD-10-CM | POA: Diagnosis not present

## 2018-06-27 DIAGNOSIS — F322 Major depressive disorder, single episode, severe without psychotic features: Secondary | ICD-10-CM | POA: Diagnosis not present

## 2018-06-27 DIAGNOSIS — Z0000 Encounter for general adult medical examination without abnormal findings: Secondary | ICD-10-CM | POA: Diagnosis not present

## 2018-06-27 DIAGNOSIS — E7800 Pure hypercholesterolemia, unspecified: Secondary | ICD-10-CM | POA: Diagnosis not present

## 2018-06-27 DIAGNOSIS — M4012 Other secondary kyphosis, cervical region: Secondary | ICD-10-CM | POA: Diagnosis not present

## 2018-07-16 ENCOUNTER — Telehealth: Payer: Self-pay

## 2018-07-16 DIAGNOSIS — R29898 Other symptoms and signs involving the musculoskeletal system: Secondary | ICD-10-CM

## 2018-07-16 DIAGNOSIS — R292 Abnormal reflex: Secondary | ICD-10-CM

## 2018-07-16 NOTE — Telephone Encounter (Signed)
-----   Message from [name], MD sent at 07/15/2018 10:18 AM EDT ----- Regarding: MRI cervical spine Pls let wife know I received the notes from Dr. Jodi Mourning office that they had done an xray of his neck showing severe arthritis. An MRI was not done, this can show [place] if the arthritis is narrowing the spinal cord and affecting his muscle strength. How is he doing? If better, we can hold off, but if no change, would do MRI cervical spine without contrast, dx hyperreflexia and right-sided weakness. Thanks!

## 2018-07-16 NOTE — Telephone Encounter (Signed)
Spoke with pt's wife, Sydell Axon, relaying message below.  Sydell Axon states that pt is doing "about the same"  Will place orders for MRI cervical spine without contrast.

## 2018-07-16 NOTE — Telephone Encounter (Signed)
Orders placed for MRI without contrast.  Pt's wife aware that [facility] will contact her about scheduling.

## 2018-08-02 ENCOUNTER — Ambulatory Visit
Admission: RE | Admit: 2018-08-02 | Discharge: 2018-08-02 | Disposition: A | Discharge status: Home / Self Care | Payer: Medicare PPO | Source: Ambulatory Visit | Attending: Neurology | Admitting: Neurology

## 2018-08-02 DIAGNOSIS — R292 Abnormal reflex: Secondary | ICD-10-CM

## 2018-08-02 DIAGNOSIS — R29898 Other symptoms and signs involving the musculoskeletal system: Secondary | ICD-10-CM

## 2018-08-02 DIAGNOSIS — M4802 Spinal stenosis, cervical region: Secondary | ICD-10-CM | POA: Diagnosis not present

## 2018-08-04 DIAGNOSIS — F0280 Dementia in other diseases classified elsewhere without behavioral disturbance: Secondary | ICD-10-CM | POA: Diagnosis not present

## 2018-08-04 DIAGNOSIS — F0390 Unspecified dementia without behavioral disturbance: Secondary | ICD-10-CM | POA: Diagnosis not present

## 2018-08-04 DIAGNOSIS — N401 Enlarged prostate with lower urinary tract symptoms: Secondary | ICD-10-CM | POA: Diagnosis not present

## 2018-08-04 DIAGNOSIS — I2510 Atherosclerotic heart disease of native coronary artery without angina pectoris: Secondary | ICD-10-CM | POA: Diagnosis not present

## 2018-08-04 DIAGNOSIS — M4012 Other secondary kyphosis, cervical region: Secondary | ICD-10-CM | POA: Diagnosis not present

## 2018-08-04 DIAGNOSIS — G20 Parkinson's disease: Secondary | ICD-10-CM | POA: Diagnosis not present

## 2018-08-04 DIAGNOSIS — F322 Major depressive disorder, single episode, severe without psychotic features: Secondary | ICD-10-CM | POA: Diagnosis not present

## 2018-08-04 DIAGNOSIS — E7800 Pure hypercholesterolemia, unspecified: Secondary | ICD-10-CM | POA: Diagnosis not present

## 2018-08-06 DIAGNOSIS — N401 Enlarged prostate with lower urinary tract symptoms: Secondary | ICD-10-CM | POA: Diagnosis not present

## 2018-08-06 DIAGNOSIS — R972 Elevated prostate specific antigen [PSA]: Secondary | ICD-10-CM | POA: Diagnosis not present

## 2018-08-06 DIAGNOSIS — R351 Nocturia: Secondary | ICD-10-CM | POA: Diagnosis not present

## 2018-08-12 DIAGNOSIS — E785 Hyperlipidemia, unspecified: Secondary | ICD-10-CM | POA: Insufficient documentation

## 2018-08-12 NOTE — Progress Notes (Signed)
Cardiology Office Note:    Date:  06/23/202019   ID:  Gavin Campbell, DOB 02/02/1935, MRN [id number]  PCP:  Vernie Shanks, MD  Cardiologist:  Sinclair Grooms, MD   Referring MD: Vernie Shanks, MD   Chief Complaint  Patient presents with  . Hypertension    History of Present Illness:    Gavin Campbell is a 83 y.o. male with a hx of CAD with prior abormal nuclear, CAC by CT, hypertension, memory deficit and possible Parkinson's disease.  Ms. Suit is accompanied by his wife.  He is doing quite well from cardiac standpoint.  He specifically denies chest pain, orthopnea, PND, and syncope.  According to his wife he will get out and walk around in the neighborhood some.  He is not his regular with physical activity as he once was.  He has some difficulty with ambulation and at times difficulty with balance.  He has not fallen or had any significant injury.  Palpitations have not been a significant issue.  Chest burning as he had in the past has not recurred.  Cardiac evaluation was done for the chest burning in the past and did not reveal any significant coronary disease.  Past Medical History:  Diagnosis Date  . Blurred vision   . Chest pain 2007   ABNORMAL CARDIOLITE   . Coronary arteriosclerosis in native artery   . Diplopia   . Early satiety   . Hyperlipidemia   . Hypertension   . Left ventricular systolic dysfunction   . Orthostatic dizziness   . Parotid mass   . Pure hypercholesterolemia   . Unsteady gait   . Venous stasis dermatitis of right lower extremity     Past Surgical History:  Procedure Laterality Date  . CORONARY ANGIOPLASTY  2009   NON OBSTRUCTIVE DISEASE    Current Medications: Current Meds  Medication Sig  . amLODipine (NORVASC) 2.5 MG tablet Take 2.5 mg by mouth daily.  Marland Kitchen aspirin 81 MG tablet Take 81 mg by mouth daily.  . carbidopa-levodopa (SINEMET IR) 25-100 MG tablet Take 1/2 tablet three times a day with meals  . Coenzyme Q10 10 MG  capsule Take 10 mg by mouth daily.  Marland Kitchen desoximetasone (TOPICORT) 0.25 % cream MASSAGE ON RASH D  . memantine (NAMENDA) 5 MG tablet Take 1 tablet twice a day  . metoprolol succinate (TOPROL-XL) 25 MG 24 hr tablet Take 25 mg by mouth daily.  . ranitidine (ZANTAC) 150 MG tablet Take 150 mg by mouth at bedtime.  . simvastatin (ZOCOR) 20 MG tablet Take 20 mg by mouth daily.  . TRIAMCINOLONE ACETONIDE EX Apply topically 2 (two) times daily.     Allergies:   Patient has no known allergies.   Social History   Socioeconomic History  . Marital status: Married    Spouse name: Not on file  . Number of children: Not on file  . Years of education: Not on file  . Highest education level: Not on file  Occupational History  . Not on file  Social Needs  . Financial resource strain: Not on file  . Food insecurity:    Worry: Not on file    Inability: Not on file  . Transportation needs:    Medical: Not on file    Non-medical: Not on file  Tobacco Use  . Smoking status: Former Smoker    Last attempt to quit: 04/03/1969    Years since quitting: 49.3  . Smokeless tobacco: Never  Used  Substance and Sexual Activity  . Alcohol use: No    Alcohol/week: 0.0 standard drinks  . Drug use: No  . Sexual activity: Not on file    Comment: MARRIED  Lifestyle  . Physical activity:    Days per week: Not on file    Minutes per session: Not on file  . Stress: Not on file  Relationships  . Social connections:    Talks on phone: Not on file    Gets together: Not on file    Attends religious service: Not on file    Active member of club or organization: Not on file    Attends meetings of clubs or organizations: Not on file    Relationship status: Not on file  Other Topics Concern  . Not on file  Social History Narrative  . Not on file     Family History: The patient's family history includes Dementia in his mother; Healthy in his father and mother.  ROS:   Please see the history of present illness.     He is on therapy for memory retention (Namenda) and Sinemet for the possibility of Parkinson's disease.  This is apparently being monitored by primary care physician, Dr. Yaakov Guthrie.  Muscle pain in the right arm from time to time.  Some depression and anxiety.  Difficulty with balance.  Occasional vision disturbance.  All other systems reviewed and are negative.  EKGs/Labs/Other Studies Reviewed:    The following studies were reviewed today: No new cardiac evaluation or functional testing.  EKG:  EKG is  ordered today.  The ekg ordered today demonstrates normal sinus rhythm, prominent voltage, and when compared to the prior tracing performed August 31, 202018, no change has occurred.  Recent Labs: No results found for requested labs within last 8760 hours.  Recent Lipid Panel No results found for: CHOL, TRIG, HDL, CHOLHDL, VLDL, LDLCALC, LDLDIRECT  Physical Exam:    VS:  BP 118/80   Pulse 72   Ht 5\' 11"  (1.803 m)   Wt 153 lb 1.9 oz (69.5 kg)   BMI 21.36 kg/m     Wt Readings from Last 3 Encounters:  08/13/18 153 lb 1.9 oz (69.5 kg)  06/10/18 146 lb (66.2 kg)  11/29/17 154 lb (69.9 kg)     GEN: [place].  Well nourished, well developed in no acute distress HEENT: Normal NECK: No JVD. LYMPHATICS: No lymphadenopathy CARDIAC: RRR, no murmur, no gallop, no edema. VASCULAR: 2+ bilateral radial and carotid pulses.  No bruits. RESPIRATORY:  Clear to auscultation without rales, wheezing or rhonchi  ABDOMEN: Soft, non-tender, non-distended, No pulsatile mass, MUSCULOSKELETAL: No deformity  SKIN: Warm and dry NEUROLOGIC:  Alert and oriented x 3 PSYCHIATRIC:  Normal affect   ASSESSMENT:    1. Mild dementia (HCC)   2. Chest pain due to myocardial ischemia, unspecified ischemic chest pain type   3. Hyperlipidemia LDL goal <70   4. Essential hypertension    PLAN:    In order of problems listed above:  1. Currently being followed by primary care.  On Namenda therapy. 2. No current  complaints of chest discomfort. 3. Current lipid levels are quite good with most recent LDL of 72 in September 2019. 4. Excellent blood pressure control.  Target less than 140/90 mmHg with ideal 130/80 mmHg.  Encourage aerobic activity but being careful to avoid injury.  No specific cardiac work-up is needed.  Clinical follow-up with me in 1 year or as needed.   Medication Adjustments/Labs and  Tests Ordered: Current medicines are reviewed at length with the patient today.  Concerns regarding medicines are outlined above.  Orders Placed This Encounter  Procedures  . EKG 12-Lead   No orders of the defined types were placed in this encounter.   Patient Instructions  Medication Instructions:  Your physician recommends that you continue on your current medications as directed. Please refer to the Current Medication list given to you today.  If you need a refill on your cardiac medications before your next appointment, please call your pharmacy.   Lab work: none If you have labs (blood work) drawn today and your tests are completely normal, you will receive your results only by: Marland Kitchen MyChart Message (if you have MyChart) OR . A paper copy in the mail If you have any lab test that is abnormal or we need to change your treatment, we will call you to review the results.  Testing/Procedures: none  Follow-Up: At [facility], you and your health needs are our priority.  As part of our continuing mission to provide you with exceptional heart care, we have created designated Provider Care Teams.  These Care Teams include your primary Cardiologist (physician) and Advanced Practice Providers (APPs -  Physician Assistants and Nurse Practitioners) who all work together to provide you with the care you need, when you need it. You will need a follow up appointment in 12 years.  Please call our office 2 months in advance to schedule this appointment.  You may see Sinclair Grooms, MD or one of the  following Advanced Practice Providers on your designated Care Team:   Truitt Merle, NP Cecilie Kicks, NP . Kathyrn Drown, NP  Any Other Special Instructions Will Be Listed Below (If Applicable).       Signed, Sinclair Grooms, MD  03-29-202019 2:10 PM    [facility]

## 2018-08-13 ENCOUNTER — Ambulatory Visit: Payer: Medicare PPO | Admitting: Interventional Cardiology

## 2018-08-13 ENCOUNTER — Encounter: Payer: Self-pay | Admitting: Interventional Cardiology

## 2018-08-13 VITALS — BP 118/80 | HR 72 | Ht 71.0 in | Wt 153.1 lb

## 2018-08-13 DIAGNOSIS — I10 Essential (primary) hypertension: Secondary | ICD-10-CM | POA: Diagnosis not present

## 2018-08-13 DIAGNOSIS — I259 Chronic ischemic heart disease, unspecified: Secondary | ICD-10-CM | POA: Diagnosis not present

## 2018-08-13 DIAGNOSIS — F0390 Unspecified dementia without behavioral disturbance: Secondary | ICD-10-CM | POA: Diagnosis not present

## 2018-08-13 DIAGNOSIS — E785 Hyperlipidemia, unspecified: Secondary | ICD-10-CM

## 2018-08-13 DIAGNOSIS — F03A0 Unspecified dementia, mild, without behavioral disturbance, psychotic disturbance, mood disturbance, and anxiety: Secondary | ICD-10-CM

## 2018-08-13 NOTE — Patient Instructions (Signed)
Medication Instructions:  Your physician recommends that you continue on your current medications as directed. Please refer to the Current Medication list given to you today.  If you need a refill on your cardiac medications before your next appointment, please call your pharmacy.   Lab work: none If you have labs (blood work) drawn today and your tests are completely normal, you will receive your results only by: Marland Kitchen MyChart Message (if you have MyChart) OR . A paper copy in the mail If you have any lab test that is abnormal or we need to change your treatment, we will call you to review the results.  Testing/Procedures: none  Follow-Up: At [facility], you and your health needs are our priority.  As part of our continuing mission to provide you with exceptional heart care, we have created designated Provider Care Teams.  These Care Teams include your primary Cardiologist (physician) and Advanced Practice Providers (APPs -  Physician Assistants and Nurse Practitioners) who all work together to provide you with the care you need, when you need it. You will need a follow up appointment in 12 years.  Please call our office 2 months in advance to schedule this appointment.  You may see Sinclair Grooms, MD or one of the following Advanced Practice Providers on your designated Care Team:   Truitt Merle, NP Cecilie Kicks, NP . Kathyrn Drown, NP  Any Other Special Instructions Will Be Listed Below (If Applicable).

## 2018-08-19 ENCOUNTER — Encounter: Payer: Self-pay | Admitting: Sports Medicine

## 2018-08-19 ENCOUNTER — Ambulatory Visit: Payer: Medicare PPO | Admitting: Sports Medicine

## 2018-08-19 DIAGNOSIS — F0390 Unspecified dementia without behavioral disturbance: Secondary | ICD-10-CM

## 2018-08-19 DIAGNOSIS — M79674 Pain in right toe(s): Secondary | ICD-10-CM

## 2018-08-19 DIAGNOSIS — B351 Tinea unguium: Secondary | ICD-10-CM | POA: Diagnosis not present

## 2018-08-19 DIAGNOSIS — F03A0 Unspecified dementia, mild, without behavioral disturbance, psychotic disturbance, mood disturbance, and anxiety: Secondary | ICD-10-CM

## 2018-08-19 DIAGNOSIS — M79675 Pain in left toe(s): Secondary | ICD-10-CM

## 2018-08-19 NOTE — Progress Notes (Signed)
Patient ID: Gavin Campbell, male   DOB: 03/12/1935, 83 y.o.   MRN: [id number] Subjective: Gavin Campbell is a 83 y.o. male patient seen today in office with complaint of painful thickened and elongated toenails; unable to trim. Patient denies any changes to medical history. Patient has no other pedal complaints at this time. No other issues noted.   Patient Active Problem List   Diagnosis Date Noted  . Hyperlipidemia 08/12/2018  . Mild dementia ([place]) 05/24/2017  . Chest pain 04/03/2017  . Mass of parotid gland 11/29/2015    Current Outpatient Medications on File Prior to Visit  Medication Sig Dispense Refill  . amLODipine (NORVASC) 2.5 MG tablet Take 2.5 mg by mouth daily.    Marland Kitchen aspirin 81 MG tablet Take 81 mg by mouth daily.    . carbidopa-levodopa (SINEMET IR) 25-100 MG tablet Take 1/2 tablet three times a day with meals 30 tablet 11  . Coenzyme Q10 10 MG capsule Take 10 mg by mouth daily.    Marland Kitchen desoximetasone (TOPICORT) 0.25 % cream MASSAGE ON RASH D  5  . memantine (NAMENDA) 5 MG tablet Take 1 tablet twice a day 180 tablet 3  . metoprolol succinate (TOPROL-XL) 25 MG 24 hr tablet Take 25 mg by mouth daily.    . ranitidine (ZANTAC) 150 MG tablet Take 150 mg by mouth at bedtime.    . simvastatin (ZOCOR) 20 MG tablet Take 20 mg by mouth daily.    . TRIAMCINOLONE ACETONIDE EX Apply topically 2 (two) times daily.     No current facility-administered medications on file prior to visit.     No Known Allergies  Objective: Physical Exam  General: Well developed, nourished, no acute distress, awake, alert and oriented x 3  Vascular: Dorsalis pedis artery 2/4 bilateral, Posterior tibial artery 1/4 bilateral, skin temperature warm to warm proximal to distal bilateral lower extremities, no varicosities, pedal hair present bilateral.  Neurological: Gross sensation present via light touch bilateral.   Dermatological: Skin is warm, dry, and supple bilateral, Nails 1-10 are tender, long,  thick, and discolored with mild subungal debris, no webspace macerations present bilateral, no open lesions present bilateral, no callus/corns/hyperkeratotic tissue present bilateral. No signs of infection bilateral.  Musculoskeletal: Bunion and hammertoe deformities noted bilateral. Muscular strength within normal limits without painon range of motion. No pain with calf compression bilateral.  Assessment and Plan:  Problem List Items Addressed This Visit      Nervous and Auditory   Mild dementia ([place])    Other Visit Diagnoses    Dermatophytosis of nail    -  Primary   Toe pain, bilateral          -Examined patient.  -Mechanically debrided and reduced mycotic nails with sterile nail nipper and dremel nail file without incident. -Patient to return in 3 months for follow up evaluation or sooner if symptoms worsen.  Landis Martins, DPM

## 2018-08-21 ENCOUNTER — Telehealth: Payer: Self-pay

## 2018-08-21 DIAGNOSIS — R29898 Other symptoms and signs involving the musculoskeletal system: Secondary | ICD-10-CM

## 2018-08-21 NOTE — Telephone Encounter (Signed)
-----   Message from [name], MD sent at 08/04/2018  3:20 PM EDT ----- Pls let patient/wife know the MRI cervical spine showed arthritis changes causing pinched nerves. The spinal cord looks good. This is likely causing his symptoms on the right hand, I usually start with Physical Therapy first, but if he would like a referral to Ortho, we can do that as well. Thanks

## 2018-08-21 NOTE — Telephone Encounter (Signed)
Spoke with pt's wife, Sydell Axon, relaying message below.  She states that she would like both referral placed.

## 2018-08-21 NOTE — Telephone Encounter (Signed)
Orders placed in system.  Pt's wife aware that PT and ortho offices will be in touch to schedule

## 2018-08-27 ENCOUNTER — Ambulatory Visit (INDEPENDENT_AMBULATORY_CARE_PROVIDER_SITE_OTHER): Payer: Medicare PPO | Admitting: Orthopaedic Surgery

## 2018-08-27 ENCOUNTER — Ambulatory Visit (INDEPENDENT_AMBULATORY_CARE_PROVIDER_SITE_OTHER): Payer: Medicare PPO

## 2018-08-27 ENCOUNTER — Encounter (INDEPENDENT_AMBULATORY_CARE_PROVIDER_SITE_OTHER): Payer: Self-pay | Admitting: Orthopaedic Surgery

## 2018-08-27 DIAGNOSIS — M25511 Pain in right shoulder: Secondary | ICD-10-CM | POA: Diagnosis not present

## 2018-08-27 MED ORDER — METHYLPREDNISOLONE ACETATE 40 MG/ML IJ SUSP
40.0000 mg | INTRAMUSCULAR | Status: AC | PRN
  Administered 2018-08-27: 40 mg via INTRA_ARTICULAR

## 2018-08-27 MED ORDER — LIDOCAINE HCL 2 % IJ SOLN
2.0000 mL | INTRAMUSCULAR | Status: AC | PRN
  Administered 2018-08-27: 2 mL

## 2018-08-27 MED ORDER — BUPIVACAINE HCL 0.25 % IJ SOLN
2.0000 mL | INTRAMUSCULAR | Status: AC | PRN
  Administered 2018-08-27: 2 mL via INTRA_ARTICULAR

## 2018-08-27 NOTE — Progress Notes (Signed)
Office Visit Note   Patient: Gavin Campbell           Date of Birth: 07/27/1935           MRN: [id number] Visit Date: 08/27/2018              Requested by: Vernie Shanks, MD [place], [place] [address] PCP: Vernie Shanks, MD   Assessment & Plan: Visit Diagnoses:  1. Right shoulder pain, unspecified chronicity     Plan: Impression is right shoulder subacromial bursitis versus questionable attritional rotator cuff tear.  Today, we proceeded with subacromial cortisone injection to the right shoulder.  We will also provided the patient with a Jobe exercise program.  He will give the injection at least 2 weeks to kick in.  If he is not any better following that point, he will call us and let us know and will obtain an MRI of the right shoulder to assess his rotator cuff.  Follow-up with [place] as needed otherwise.  Follow-Up Instructions: Return if symptoms worsen or fail to improve.   Orders:  Orders Placed This Encounter  Procedures  . Large Joint Inj: R subacromial bursa  . XR Shoulder Right   No orders of the defined types were placed in this encounter.     Procedures: Large Joint Inj: R subacromial bursa on 08/27/2018 2:47 PM Indications: pain Details: 22 G needle Medications: 2 mL lidocaine 2 %; 2 mL bupivacaine 0.25 %; 40 mg methylPREDNISolone acetate 40 MG/ML Outcome: tolerated well, no immediate complications Patient was prepped and draped in the usual sterile fashion.       Clinical Data: No additional findings.   Subjective: Chief Complaint  Patient presents with  . 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  He comes in today with right arm pain and weakness.  No known injury or change in activity.  The pain he has is to the entire right shoulder radiating into the deltoid.  He describes this as constant in nature and worse with forward flexion, abduction and internal  rotation.  He has not taken any medication for this.  No numbness, tingling or burning.  No previous cortisone injection or surgical intervention.  Review of Systems as detailed in HPI.  All others reviewed and are negative.   Objective: Vital Signs: There were no vitals taken for this visit.  Physical Exam well-developed well-nourished gentleman in no acute distress.  Alert and oriented x3  Ortho Exam.  Examination of the right shoulder reveals near full active range of motion all planes although this is painful with the extremes.  Markedly positive empty can and cross body abduction.  Negative drop arm.  4 out of 5 strength throughout.  He is neurovascularly intact distally.  Specialty Comments:  No specialty comments available.  Imaging: Xr Shoulder Right  Result Date: 08/27/2018 Moderate AC arthropathy with slight joint space narrowing glenohumeral joint.  He does have a downsloping acromion.    PMFS History: Patient Active Problem List   Diagnosis Date Noted  . Hyperlipidemia 08/12/2018  . Mild dementia ([place]) 05/24/2017  . Chest pain 04/03/2017  . Mass of parotid gland 11/29/2015   Past Medical History:  Diagnosis Date  . Blurred vision   . Chest pain 2007   ABNORMAL CARDIOLITE   . Coronary arteriosclerosis in native artery   . Diplopia   . Early satiety   .  Hyperlipidemia   . Hypertension   . Left ventricular systolic dysfunction   . Orthostatic dizziness   . Parotid mass   . Pure hypercholesterolemia   . Unsteady gait   . Venous stasis dermatitis of right lower extremity     Family History  Problem Relation Age of Onset  . Healthy Mother   . Dementia Mother   . Healthy Father     Past Surgical History:  Procedure Laterality Date  . CORONARY ANGIOPLASTY  2009   NON OBSTRUCTIVE DISEASE   Social History   Occupational History  . Not on file  Tobacco Use  . Smoking status: Former Smoker    Last attempt to quit: 04/03/1969    Years since quitting:  49.4  . Smokeless tobacco: Never Used  Substance and Sexual Activity  . Alcohol use: No    Alcohol/week: 0.0 standard drinks  . Drug use: No  . Sexual activity: Not on file    Comment: MARRIED

## 2018-09-29 ENCOUNTER — Ambulatory Visit: Payer: Medicare PPO | Attending: Family Medicine | Admitting: Occupational Therapy

## 2018-09-29 ENCOUNTER — Encounter: Payer: Self-pay | Admitting: Occupational Therapy

## 2018-09-29 ENCOUNTER — Telehealth: Payer: Self-pay | Admitting: Occupational Therapy

## 2018-09-29 ENCOUNTER — Other Ambulatory Visit: Payer: Self-pay

## 2018-09-29 DIAGNOSIS — R278 Other lack of coordination: Secondary | ICD-10-CM

## 2018-09-29 DIAGNOSIS — R293 Abnormal posture: Secondary | ICD-10-CM | POA: Diagnosis not present

## 2018-09-29 DIAGNOSIS — R29898 Other symptoms and signs involving the musculoskeletal system: Secondary | ICD-10-CM

## 2018-09-29 DIAGNOSIS — R41840 Attention and concentration deficit: Secondary | ICD-10-CM | POA: Diagnosis not present

## 2018-09-29 DIAGNOSIS — R2689 Other abnormalities of gait and mobility: Secondary | ICD-10-CM | POA: Insufficient documentation

## 2018-09-29 DIAGNOSIS — R2681 Unsteadiness on feet: Secondary | ICD-10-CM | POA: Diagnosis not present

## 2018-09-29 DIAGNOSIS — R29818 Other symptoms and signs involving the nervous system: Secondary | ICD-10-CM | POA: Diagnosis not present

## 2018-09-29 DIAGNOSIS — R41842 Visuospatial deficit: Secondary | ICD-10-CM | POA: Diagnosis not present

## 2018-09-29 DIAGNOSIS — R41844 Frontal lobe and executive function deficit: Secondary | ICD-10-CM | POA: Insufficient documentation

## 2018-09-29 NOTE — Telephone Encounter (Signed)
Dr. Delice Lesch,  Mr. Gavin Campbell was seen today for his occupational therapy evaluation.  He reports/demonstrates decr functional mobility and transfers.  He also reports that in the last few weeks he has not felt comfortable walking for exercise in neighborhood anymore.  He may benefit from physical therapy evaluation to address these functional changes.  If you agree, please place physical therapy order via epic.    Thank you,  Vianne Bulls, OTR/L [facility] [address] [place], [place]  [address] [phone] phone [phone] 09/29/18 3:59 PM

## 2018-09-29 NOTE — Therapy (Signed)
[facility] [address] [place], [place], [address] Phone: [phone]   Fax:  [phone]  Occupational Therapy Evaluation  Patient Details  Name: Gavin Campbell MRN: [id number] Date of Birth: 04-17-1935 Referring Provider (OT): Dr. Ellouise Newer   Encounter Date: 09/29/2018  OT End of Session - 09/29/18 1527    Visit Number  1    Number of Visits  17    Date for OT Re-Evaluation  11/29/18    Authorization Type  [employer]--awaiting insurance auth    Authorization Time Period  cert. 09/29/18-12/28/18    Authorization - Visit Number  1    Authorization - Number of Visits  10    OT Start Time  1320    OT Stop Time  1405    OT Time Calculation (min)  45 min    Activity Tolerance  Patient tolerated treatment well    Behavior During Therapy  WFL for tasks assessed/performed;Flat affect       Past Medical History:  Diagnosis Date  . Blurred vision   . Chest pain 2007   ABNORMAL CARDIOLITE   . Coronary arteriosclerosis in native artery   . Diplopia   . Early satiety   . Hyperlipidemia   . Hypertension   . Left ventricular systolic dysfunction   . Orthostatic dizziness   . Parotid mass   . Pure hypercholesterolemia   . Unsteady gait   . Venous stasis dermatitis of right lower extremity     Past Surgical History:  Procedure Laterality Date  . CORONARY ANGIOPLASTY  2009   NON OBSTRUCTIVE DISEASE    There were no vitals filed for this visit.  Subjective Assessment - 09/29/18 1328    Subjective   I'm stiff, I have trouble getting up, "It's pulling me forward"    Pertinent History  Parkinsonism (diagnosed approx 6 months ago per pt); hypertension, hyperlipidemia, bradycardia, with mild dementia, diplopia    Limitations  diplopia, mild dementia    Patient Stated Goals  improve posture, stiffness, easier to dress    Currently in Pain?  No/denies        [facility] OT Assessment - 09/29/18 0001      Assessment   Medical Diagnosis  hand weakness (mild dementia and Parkinsonism)    Referring Provider (OT)  Dr. Ellouise Newer    Onset Date/Surgical Date  --   Parkinsonism dx approx 6 months ago, difficulty for a year   Hand Dominance  Right    Prior Therapy  none; had cortisone injection to R shoulder about 1 month ago      Precautions   Precautions  None      Balance Screen   Has the patient fallen in the past 6 months  No    Has the patient had a decrease in activity level because of a fear of falling?   Yes      Home  Environment   Family/patient expects to be discharged to:  Private residence    Living Arrangements  Spouse/significant other      Prior Function   Level of [place]  Retired   stopped working about 2 months ago due to difficulty   Leisure  lost socialization when stopped working, was walking, but stopped past few weeks      ADL   Eating/Feeding  --   decr control/spills, difficulty cutting    Grooming  Modified independent    Upper Body  Bathing  Modified independent    Lower Body Bathing  Increased time    Upper Body Dressing  Needs assist for fasteners   difficulty with donning jackets, shirts, buttons   Lower Body Dressing  --   difficulty with socks   Toilet Transfer  Modified independent    Toileting - Clothing Manipulation  Modified independent    Toileting -  Hygiene  Modified Independent    Tub/Shower Transfer  Modified independent    Warden/ranger  Grab bars   tub/shower   Transfers/Ambulation Related to ADL's  difficulty with bed mobility, difficulty with sit>stand, difficulty getting in/out of car    ADL comments  pt reports difficulty opening bottles/packages      IADL   Prior Level of Function Light Housekeeping  wife has always performed    Prior Level of Function Meal Prep  wife has always performed    Community Mobility  Relies on family or friends for transportation   stopped driving about 2  months ago   Prior Level of Function Financial Management  pt performed     Financial Management  --   wife performing now, difficulty with calculating     Mobility   Mobility Status  Independent    Mobility Status Comments  difficulty with sit>stand, smaller step size      Written Expression   Dominant Hand  Right    Handwriting  --   difficulty, typically only writes name     Vision - History   Baseline Vision  Bifocals    Additional Comments  Pt reports diplopia on L side most of the time      Cognition   Overall Cognitive Status  Impaired/Different from baseline    Area of Impairment  Memory;Awareness;Attention    Bradyphrenia  Yes    Cognition Comments  Pt reports having to re-read info 3-4x       Observation/Other Assessments   Other Surveys   Select    Physical Performance Test    Yes    Simulated Eating Time (seconds)  19.12    Simulated Eating Comments  holds spoon at end    Donning Doffing Jacket Time (seconds)  23.44    Donning Doffing Jacket Comments  fasten/unfasten 3 buttons in 75min 5sec   noted to use L hand more during bilateral task     Posture/Postural Control   Posture/Postural Control  Postural limitations    Postural Limitations  Rounded Shoulders;Forward head;Flexed trunk      Sensation   Additional Comments  Pt denies changes      Coordination   9 Hole Peg Test  Right;Left    Right 9 Hole Peg Test  35.56    Left 9 Hole Peg Test  34.97    Box and Blocks  R-27blocks, L-38blocks      Tone   Assessment Location  Right Upper Extremity;Left Upper Extremity      ROM / Strength   AROM / PROM / Strength  AROM      AROM   Overall AROM   Within functional limits for tasks performed   BUEs     RUE Tone   RUE Tone  Mild      LUE Tone   LUE Tone  --   very mild RUE>LUE                     OT Education - 09/29/18 1511    Education Details  OT  eval results & POC; Recommendation for Physical Therapy  (pt/wife agrees)    Person(s)  Educated  Patient;Spouse    Methods  Explanation    Comprehension  Verbalized understanding       OT Short Term Goals - 09/29/18 1539      OT SHORT TERM GOAL #1   Title  Pt will be independent with PD-specific HEP to address posture, bradykinesia, rigidity, and coordination.--check STGs 10/29/18    Time  4    Period  Weeks    Status  New      OT SHORT TERM GOAL #2   Title  Pt will verbalize understanding of ways to prevent future complications related to PD and appropriate community resources.    Time  4    Period  Weeks    Status  New      OT SHORT TERM GOAL #3   Title  Pt will improve coordination as shown by fastening/unfastening 3 buttons in less than 65min.    Time  4    Period  Weeks    Status  New      OT SHORT TERM GOAL #4   Title  Pt will improve ability to dress as shown by completing PPT #4 in less than 20sec.    Time  4    Period  Weeks    Status  New      OT SHORT TERM GOAL #5   Title  Establish goal for standing functional reach as appropriate    Time  4    Period  Weeks    Status  New      OT SHORT TERM GOAL #6   Title  Pt will report incr ease with donning/doffing socks and pull-over shirt.    Time  4    Period  Weeks    Status  New        OT Long Term Goals - 09/29/18 1544      OT LONG TERM GOAL #1   Title  Pt will verbalize understanding of adaptive stratgies to incr. safety/ease/independence with ADLs/IADLs.--check LTGs 11/29/18    Time  8    Period  Weeks    Status  New      OT LONG TERM GOAL #2   Title  Pt will verbalize understanding of memory compensation strategies and ways to keep thinking skills sharp.    Time  8    Period  Weeks    Status  New      OT LONG TERM GOAL #3   Title  Pt will improve functional reaching/coordination for ADLs as shown by improving score on box and blocks test by at least 6 blocks with dominant RUE.    Baseline  R-27 blocks, L-38 blocks    Time  8    Period  Weeks    Status  New      OT LONG TERM GOAL  #4   Title  Pt will improve coordination for ADLs as shown by improving time on 9-hole peg test by at least 3sec bilaterally.    Baseline  R-35.56sec, L-34.97sec    Time  8    Period  Weeks    Status  New      OT LONG TERM GOAL #5   Title  Pt will be able to fasten/unfasten 3 buttons in less than 90sec.    Baseline  71min, 5sec    Time  8    Period  Weeks  Status  New            Plan - 09/29/18 1530    Clinical Impression Statement  Pt is an 83 y.o. male referred to occupational therapy for hand weakness.   Pt with diagnosis of mild dementia and Parkinsonism (diagnosed about 6 months ago per pt).  Pt also with subacromial cortisone injection to R shoulder for shoulder pain approx a month ago which helped per pt.  Pt with PMH that also includes:  hypertension, hyperlipidemia, bradycardia.  Pt presents with bradykinesia, rigidity, decr coordination, decr posture, decr balance/functional mobility, visual deficits, cognitive deficits.  Pt would benefit from occupational therapy to address for improved ADL/IADL ease/safety, improved UE functional use, establish HEP, provide education, and reduce risk for future complications related to Parkinsonism.    Occupational Profile and client history currently impacting functional performance  Pt was independent, working as a Art gallery manager, and driving until approx 4 months ago.  Pt is now mod I with most ADLs, but receiving some assist due to effort required.  Pt is no longer working or driving.  Wife has also started doing financial management tasks.  Pt is less active socially since he stopped working and no longer walks in neighborhood like he did.    Occupational performance deficits (Please refer to evaluation for details):  ADL's;IADL's;Social Participation;Leisure;Work    Neurosurgeon    Current Impairments/barriers affecting progress:  mild dementia    OT Frequency  2x / week    OT Duration  8 weeks   +eval   OT Treatment/Interventions   Self-care/ADL training;Therapeutic exercise;Visual/perceptual remediation/compensation;Patient/family education;Neuromuscular education;Paraffin;Moist Heat;Aquatic Therapy;Fluidtherapy;Energy conservation;Therapist, nutritional;Therapeutic activities;Balance training;Cryotherapy;Ultrasound;DME and/or AE instruction;Manual Therapy;Passive range of motion;Cognitive remediation/compensation    Plan  assess standing functional reach and establish goal as appropriate; initiate PWR! supine and techniques for bed mobility (incorporate PD education); check on PT order     Clinical Decision Making  Several treatment options, min-mod task modification necessary    Recommended Other Services  physical therapy--requested order    Consulted and Agree with Plan of Care  Patient;Family member/caregiver    Family Member Consulted  spouse       Patient will benefit from skilled therapeutic intervention in order to improve the following deficits and impairments:  Decreased cognition, Decreased knowledge of use of DME, Impaired flexibility, Pain, Decreased mobility, Decreased coordination, Improper spinal/pelvic alignment, Impaired tone, Decreased strength, Decreased range of motion, Decreased activity tolerance, Decreased endurance, Decreased balance, Decreased knowledge of precautions, Impaired UE functional use(bradykinesia)  Visit Diagnosis: Other symptoms and signs involving the nervous system  Other symptoms and signs involving the musculoskeletal system  Other lack of coordination  Attention and concentration deficit  Frontal lobe and executive function deficit  Abnormal posture  Visuospatial deficit  Unsteadiness on feet  Other abnormalities of gait and mobility    Problem List Patient Active Problem List   Diagnosis Date Noted  . Hyperlipidemia 08/12/2018  . Mild dementia ([place]) 05/24/2017  . Chest pain 04/03/2017  . Mass of parotid gland 11/29/2015    [facility] 09/29/2018,  3:52 PM  [facility] [address] [place], [place], [address] Phone: [phone]   Fax:  [phone]  Name: Gavin Campbell MRN: [id number] Date of Birth: [birth year]   Vianne Bulls, OTR/L [facility] [address] [place], [place]  [address] [phone] phone [phone] 09/29/18 3:52 PM

## 2018-09-29 NOTE — Telephone Encounter (Signed)
Pls refer to PT for decr functional mobility and transfers, thanks!

## 2018-09-30 NOTE — Telephone Encounter (Signed)
Order placed in Epic.

## 2018-10-01 ENCOUNTER — Ambulatory Visit: Payer: Medicare PPO | Admitting: Occupational Therapy

## 2018-10-01 DIAGNOSIS — R41842 Visuospatial deficit: Secondary | ICD-10-CM | POA: Diagnosis not present

## 2018-10-01 DIAGNOSIS — R278 Other lack of coordination: Secondary | ICD-10-CM | POA: Diagnosis not present

## 2018-10-01 DIAGNOSIS — R41840 Attention and concentration deficit: Secondary | ICD-10-CM | POA: Diagnosis not present

## 2018-10-01 DIAGNOSIS — R2689 Other abnormalities of gait and mobility: Secondary | ICD-10-CM | POA: Diagnosis not present

## 2018-10-01 DIAGNOSIS — R29818 Other symptoms and signs involving the nervous system: Secondary | ICD-10-CM

## 2018-10-01 DIAGNOSIS — R293 Abnormal posture: Secondary | ICD-10-CM

## 2018-10-01 DIAGNOSIS — R41844 Frontal lobe and executive function deficit: Secondary | ICD-10-CM | POA: Diagnosis not present

## 2018-10-01 DIAGNOSIS — R29898 Other symptoms and signs involving the musculoskeletal system: Secondary | ICD-10-CM

## 2018-10-01 DIAGNOSIS — R2681 Unsteadiness on feet: Secondary | ICD-10-CM | POA: Diagnosis not present

## 2018-10-02 NOTE — Therapy (Signed)
[facility] [address] [place], [place], [address] Phone: [phone]   Fax:  [phone]  Occupational Therapy Treatment  Patient Details  Name: Gavin Campbell MRN: [id number] Date of Birth: Jan 08, 1935 Referring Provider (OT): Dr. Ellouise Newer   Encounter Date: 10/01/2018  OT End of Session - 10/02/18 0916    Visit Number  2    Number of Visits  17    Date for OT Re-Evaluation  11/29/18    Authorization Type  [employer]--awaiting insurance auth    Authorization Time Period  cert. 09/29/18-12/28/18    Authorization - Visit Number  2    Authorization - Number of Visits  10    OT Start Time  7616    OT Stop Time  1400    OT Time Calculation (min)  39 min    Activity Tolerance  Patient tolerated treatment well    Behavior During Therapy  WFL for tasks assessed/performed;Flat affect       Past Medical History:  Diagnosis Date  . Blurred vision   . Chest pain 2007   ABNORMAL CARDIOLITE   . Coronary arteriosclerosis in native artery   . Diplopia   . Early satiety   . Hyperlipidemia   . Hypertension   . Left ventricular systolic dysfunction   . Orthostatic dizziness   . Parotid mass   . Pure hypercholesterolemia   . Unsteady gait   . Venous stasis dermatitis of right lower extremity     Past Surgical History:  Procedure Laterality Date  . CORONARY ANGIOPLASTY  2009   NON OBSTRUCTIVE DISEASE    There were no vitals filed for this visit.  Subjective Assessment - 10/02/18 0915    Pertinent History  Parkinsonism (diagnosed approx 6 months ago per pt); hypertension, hyperlipidemia, bradycardia, with mild dementia, diplopia    Patient Stated Goals  improve posture, stiffness, easier to dress    Currently in Pain?  No/denies                 Treatment; PWR! Supine basic 4 with beginning education in how to translate to bed mobility with pt/ wife. Flipping playing cards with bilateral UE's,  mod v.c for large amplitude movements, increased time required for all activities.          OT Education - 10/02/18 0737    Education Details  PWR! supine basic 4 with PWR step simplfied to break down into stepping out and hip lifts, 10 reps each mod v.c and demonstration    Person(s) Educated  Patient;Spouse    Methods  Explanation    Comprehension  Verbalized understanding;Returned demonstration;Verbal cues required       OT Short Term Goals - 09/29/18 1539      OT SHORT TERM GOAL #1   Title  Pt will be independent with PD-specific HEP to address posture, bradykinesia, rigidity, and coordination.--check STGs 10/29/18    Time  4    Period  Weeks    Status  New      OT SHORT TERM GOAL #2   Title  Pt will verbalize understanding of ways to prevent future complications related to PD and appropriate community resources.    Time  4    Period  Weeks    Status  New      OT SHORT TERM GOAL #3   Title  Pt will improve coordination as shown by fastening/unfastening 3 buttons in less than 20min.    Time  4    Period  Weeks    Status  New      OT SHORT TERM GOAL #4   Title  Pt will improve ability to dress as shown by completing PPT #4 in less than 20sec.    Time  4    Period  Weeks    Status  New      OT SHORT TERM GOAL #5   Title  Establish goal for standing functional reach as appropriate    Time  4    Period  Weeks    Status  New      OT SHORT TERM GOAL #6   Title  Pt will report incr ease with donning/doffing socks and pull-over shirt.    Time  4    Period  Weeks    Status  New        OT Long Term Goals - 09/29/18 1544      OT LONG TERM GOAL #1   Title  Pt will verbalize understanding of adaptive stratgies to incr. safety/ease/independence with ADLs/IADLs.--check LTGs 11/29/18    Time  8    Period  Weeks    Status  New      OT LONG TERM GOAL #2   Title  Pt will verbalize understanding of memory compensation strategies and ways to keep thinking skills sharp.     Time  8    Period  Weeks    Status  New      OT LONG TERM GOAL #3   Title  Pt will improve functional reaching/coordination for ADLs as shown by improving score on box and blocks test by at least 6 blocks with dominant RUE.    Baseline  R-27 blocks, L-38 blocks    Time  8    Period  Weeks    Status  New      OT LONG TERM GOAL #4   Title  Pt will improve coordination for ADLs as shown by improving time on 9-hole peg test by at least 3sec bilaterally.    Baseline  R-35.56sec, L-34.97sec    Time  8    Period  Weeks    Status  New      OT LONG TERM GOAL #5   Title  Pt will be able to fasten/unfasten 3 buttons in less than 90sec.    Baseline  42min, 5sec    Time  8    Period  Weeks    Status  New            Plan - 10/02/18 9629    Clinical Impression Statement  Pt is progressing towards goals. He was moving better today and demonstrates improved posture following PWR! in supine.    Occupational Profile and client history currently impacting functional performance  Pt was independent, working as a Art gallery manager, and driving until approx 4 months ago.  Pt is now mod I with most ADLs, but receiving some assist due to effort required.  Pt is no longer working or driving.  Wife has also started doing financial management tasks.  Pt is less active socially since he stopped working and no longer walks in neighborhood like he did.    Occupational performance deficits (Please refer to evaluation for details):  ADL's;IADL's;Social Participation;Leisure;Work    Rehab Potential  Good    OT Frequency  2x / week    OT Duration  8 weeks    OT Treatment/Interventions  Self-care/ADL training;Therapeutic exercise;Visual/perceptual remediation/compensation;Patient/family education;Neuromuscular education;Paraffin;Moist Heat;Aquatic  Therapy;Fluidtherapy;Energy conservation;Therapist, nutritional;Therapeutic activities;Balance training;Cryotherapy;Ultrasound;DME and/or AE instruction;Manual  Therapy;Passive range of motion;Cognitive remediation/compensation    Plan  reinforce PWR supine, coordination HEP.    Consulted and Agree with Plan of Care  Patient;Family member/caregiver    Family Member Consulted  spouse       Patient will benefit from skilled therapeutic intervention in order to improve the following deficits and impairments:  Decreased cognition, Decreased knowledge of use of DME, Impaired flexibility, Pain, Decreased mobility, Decreased coordination, Improper spinal/pelvic alignment, Impaired tone, Decreased strength, Decreased range of motion, Decreased activity tolerance, Decreased endurance, Decreased balance, Decreased knowledge of precautions, Impaired UE functional use  Visit Diagnosis: Other symptoms and signs involving the musculoskeletal system  Other lack of coordination  Other symptoms and signs involving the nervous system  Attention and concentration deficit  Frontal lobe and executive function deficit  Other abnormalities of gait and mobility  Abnormal posture    Problem List Patient Active Problem List   Diagnosis Date Noted  . Hyperlipidemia 08/12/2018  . Mild dementia ([place]) 05/24/2017  . Chest pain 04/03/2017  . Mass of parotid gland 11/29/2015    Jahron Hunsinger 10/02/2018, 9:24 AM  [facility] [address] [place], [place], [address] Phone: [phone]   Fax:  [phone]  Name: KELDEN LAVALLEE MRN: [id number] Date of Birth: 1934-10-25

## 2018-10-06 ENCOUNTER — Ambulatory Visit: Payer: Medicare PPO | Admitting: Occupational Therapy

## 2018-10-06 ENCOUNTER — Encounter: Payer: Self-pay | Admitting: Occupational Therapy

## 2018-10-06 DIAGNOSIS — R278 Other lack of coordination: Secondary | ICD-10-CM

## 2018-10-06 DIAGNOSIS — R293 Abnormal posture: Secondary | ICD-10-CM | POA: Diagnosis not present

## 2018-10-06 DIAGNOSIS — R29818 Other symptoms and signs involving the nervous system: Secondary | ICD-10-CM | POA: Diagnosis not present

## 2018-10-06 DIAGNOSIS — R41842 Visuospatial deficit: Secondary | ICD-10-CM

## 2018-10-06 DIAGNOSIS — R2689 Other abnormalities of gait and mobility: Secondary | ICD-10-CM

## 2018-10-06 DIAGNOSIS — R41844 Frontal lobe and executive function deficit: Secondary | ICD-10-CM | POA: Diagnosis not present

## 2018-10-06 DIAGNOSIS — R41840 Attention and concentration deficit: Secondary | ICD-10-CM | POA: Diagnosis not present

## 2018-10-06 DIAGNOSIS — R2681 Unsteadiness on feet: Secondary | ICD-10-CM | POA: Diagnosis not present

## 2018-10-06 DIAGNOSIS — R29898 Other symptoms and signs involving the musculoskeletal system: Secondary | ICD-10-CM

## 2018-10-06 NOTE — Patient Instructions (Signed)
Coordination Exercises  Perform the following exercises for 20 minutes 1 times per day. Perform with both hand(s). Perform using big movements.   Flipping Cards: Place deck of cards on the table. Flip cards over by opening your hand big to grasp and then turn your palm up big, then open hand big to release card.  Deal cards: Hold 1/2 or whole deck in your hand. Bring thumb back and usse thumb to push card off top of deck with one big/hard  push.  Pick up coins and stack one at a time: Open hand big and Pick up with big, intentional movements. Do not drag coin to the edge. (5-10 in a stack)  Pick up 5-10 coins one at a time and hold in palm. Then, move coins from palm to fingertips one at time and place in coin bank/container.   Perform "Flicks"/hand stretches (PWR! Hands): Close hands then flick out your fingers with focus on opening hands, pulling wrists back, and extending elbows like you are pushing.

## 2018-10-06 NOTE — Therapy (Signed)
[facility] [address] [place], [place], [address] Phone: [phone]   Fax:  [phone]  Occupational Therapy Treatment  Patient Details  Name: Gavin Campbell MRN: [id number] Date of Birth: Oct 09, 1935 Referring Provider (OT): Dr. Ellouise Newer   Encounter Date: 10/06/2018  OT End of Session - 10/06/18 1325    Visit Number  3    Number of Visits  17    Date for OT Re-Evaluation  11/29/18    Authorization Type  [employer]--awaiting insurance auth    Authorization Time Period  cert. 09/29/18-12/28/18    Authorization - Visit Number  3    Authorization - Number of Visits  10    OT Start Time  8841    OT Stop Time  1401    OT Time Calculation (min)  38 min    Activity Tolerance  Patient tolerated treatment well    Behavior During Therapy  WFL for tasks assessed/performed;Flat affect       Past Medical History:  Diagnosis Date  . Blurred vision   . Chest pain 2007   ABNORMAL CARDIOLITE   . Coronary arteriosclerosis in native artery   . Diplopia   . Early satiety   . Hyperlipidemia   . Hypertension   . Left ventricular systolic dysfunction   . Orthostatic dizziness   . Parotid mass   . Pure hypercholesterolemia   . Unsteady gait   . Venous stasis dermatitis of right lower extremity     Past Surgical History:  Procedure Laterality Date  . CORONARY ANGIOPLASTY  2009   NON OBSTRUCTIVE DISEASE    There were no vitals filed for this visit.  Subjective Assessment - 10/06/18 1324    Subjective   Neck feels like it is "scrubbing" when I turn my head    Pertinent History  Parkinsonism (diagnosed approx 6 months ago per pt); hypertension, hyperlipidemia, bradycardia, with mild dementia, diplopia    Limitations  diplopia, mild dementia    Patient Stated Goals  improve posture, stiffness, easier to dress    Currently in Pain?  No/denies          Reviewed PWR! Supine (basic 4, with separate hip lift  and step out for PWR! Step).  Pt returned demo each x10-20 with mod cueing (verbal, visual, tactile) for incr movement amplitude and to sequence movement.   Began education on how HEP relates to functional movements.  Pt verbalized understanding.         OT Education - 10/06/18 1558    Education Details  Coordination HEP--see pt instructions    Person(s) Educated  Patient;Spouse    Methods  Explanation;Demonstration;Handout;Verbal cues;Tactile cues    Comprehension  Verbalized understanding;Returned demonstration;Verbal cues required;Need further instruction       OT Short Term Goals - 09/29/18 1539      OT SHORT TERM GOAL #1   Title  Pt will be independent with PD-specific HEP to address posture, bradykinesia, rigidity, and coordination.--check STGs 10/29/18    Time  4    Period  Weeks    Status  New      OT SHORT TERM GOAL #2   Title  Pt will verbalize understanding of ways to prevent future complications related to PD and appropriate community resources.    Time  4    Period  Weeks    Status  New      OT SHORT TERM GOAL #3   Title  Pt will improve coordination  as shown by fastening/unfastening 3 buttons in less than 31min.    Time  4    Period  Weeks    Status  New      OT SHORT TERM GOAL #4   Title  Pt will improve ability to dress as shown by completing PPT #4 in less than 20sec.    Time  4    Period  Weeks    Status  New      OT SHORT TERM GOAL #5   Title  Establish goal for standing functional reach as appropriate    Time  4    Period  Weeks    Status  New      OT SHORT TERM GOAL #6   Title  Pt will report incr ease with donning/doffing socks and pull-over shirt.    Time  4    Period  Weeks    Status  New        OT Long Term Goals - 09/29/18 1544      OT LONG TERM GOAL #1   Title  Pt will verbalize understanding of adaptive stratgies to incr. safety/ease/independence with ADLs/IADLs.--check LTGs 11/29/18    Time  8    Period  Weeks    Status  New       OT LONG TERM GOAL #2   Title  Pt will verbalize understanding of memory compensation strategies and ways to keep thinking skills sharp.    Time  8    Period  Weeks    Status  New      OT LONG TERM GOAL #3   Title  Pt will improve functional reaching/coordination for ADLs as shown by improving score on box and blocks test by at least 6 blocks with dominant RUE.    Baseline  R-27 blocks, L-38 blocks    Time  8    Period  Weeks    Status  New      OT LONG TERM GOAL #4   Title  Pt will improve coordination for ADLs as shown by improving time on 9-hole peg test by at least 3sec bilaterally.    Baseline  R-35.56sec, L-34.97sec    Time  8    Period  Weeks    Status  New      OT LONG TERM GOAL #5   Title  Pt will be able to fasten/unfasten 3 buttons in less than 90sec.    Baseline  86min, 5sec    Time  8    Period  Weeks    Status  New            Plan - 10/06/18 1325    Clinical Impression Statement  Pt is progressing towards goals. Pt reports that he feels better following PWR! exercises.  Pt demo improved movement amplitude with cueing.    Occupational Profile and client history currently impacting functional performance  Pt was independent, working as a Art gallery manager, and driving until approx 4 months ago.  Pt is now mod I with most ADLs, but receiving some assist due to effort required.  Pt is no longer working or driving.  Wife has also started doing financial management tasks.  Pt is less active socially since he stopped working and no longer walks in neighborhood like he did.    Occupational performance deficits (Please refer to evaluation for details):  ADL's;IADL's;Social Participation;Leisure;Work    Rehab Potential  Good    Current Impairments/barriers affecting progress:  mild dementia  OT Frequency  2x / week    OT Duration  8 weeks    OT Treatment/Interventions  Self-care/ADL training;Therapeutic exercise;Visual/perceptual remediation/compensation;Patient/family  education;Neuromuscular education;Paraffin;Moist Heat;Aquatic Therapy;Fluidtherapy;Energy conservation;Therapist, nutritional;Therapeutic activities;Balance training;Cryotherapy;Ultrasound;DME and/or AE instruction;Manual Therapy;Passive range of motion;Cognitive remediation/compensation    Plan  review coordination HEP.    Recommended Other Services  physical therapy--requested order    Consulted and Agree with Plan of Care  Patient;Family member/caregiver    Family Member Consulted  spouse       Patient will benefit from skilled therapeutic intervention in order to improve the following deficits and impairments:  Decreased cognition, Decreased knowledge of use of DME, Impaired flexibility, Pain, Decreased mobility, Decreased coordination, Improper spinal/pelvic alignment, Impaired tone, Decreased strength, Decreased range of motion, Decreased activity tolerance, Decreased endurance, Decreased balance, Decreased knowledge of precautions, Impaired UE functional use  Visit Diagnosis: Other symptoms and signs involving the musculoskeletal system  Other lack of coordination  Other symptoms and signs involving the nervous system  Attention and concentration deficit  Frontal lobe and executive function deficit  Abnormal posture  Other abnormalities of gait and mobility  Unsteadiness on feet  Visuospatial deficit    Problem List Patient Active Problem List   Diagnosis Date Noted  . Hyperlipidemia 08/12/2018  . Mild dementia ([place]) 05/24/2017  . Chest pain 04/03/2017  . Mass of parotid gland 11/29/2015    [facility] 10/06/2018, 3:58 PM  [facility] [address] [place], [place], [address] Phone: [phone]   Fax:  [phone]  Name: Gavin Campbell MRN: [id number] Date of Birth: Jul 30, 1935   Vianne Bulls, OTR/L [facility] [address] [place], [place]   [address] [phone] phone [phone] 10/06/18 3:58 PM

## 2018-10-20 ENCOUNTER — Other Ambulatory Visit: Payer: Self-pay

## 2018-10-20 ENCOUNTER — Encounter: Payer: Self-pay | Admitting: Rehabilitative and Restorative Service Providers"

## 2018-10-20 ENCOUNTER — Ambulatory Visit: Payer: Medicare PPO | Admitting: Occupational Therapy

## 2018-10-20 ENCOUNTER — Ambulatory Visit: Payer: Medicare PPO | Attending: Family Medicine | Admitting: Rehabilitative and Restorative Service Providers"

## 2018-10-20 ENCOUNTER — Encounter: Payer: Self-pay | Admitting: Occupational Therapy

## 2018-10-20 DIAGNOSIS — R41844 Frontal lobe and executive function deficit: Secondary | ICD-10-CM

## 2018-10-20 DIAGNOSIS — R41842 Visuospatial deficit: Secondary | ICD-10-CM | POA: Insufficient documentation

## 2018-10-20 DIAGNOSIS — R29818 Other symptoms and signs involving the nervous system: Secondary | ICD-10-CM | POA: Diagnosis not present

## 2018-10-20 DIAGNOSIS — R293 Abnormal posture: Secondary | ICD-10-CM | POA: Diagnosis not present

## 2018-10-20 DIAGNOSIS — R278 Other lack of coordination: Secondary | ICD-10-CM

## 2018-10-20 DIAGNOSIS — R41840 Attention and concentration deficit: Secondary | ICD-10-CM | POA: Insufficient documentation

## 2018-10-20 DIAGNOSIS — R29898 Other symptoms and signs involving the musculoskeletal system: Secondary | ICD-10-CM | POA: Diagnosis not present

## 2018-10-20 DIAGNOSIS — R2689 Other abnormalities of gait and mobility: Secondary | ICD-10-CM

## 2018-10-20 DIAGNOSIS — R2681 Unsteadiness on feet: Secondary | ICD-10-CM

## 2018-10-20 NOTE — Therapy (Signed)
[facility] [address] [place], [place], [address] Phone: [phone]   Fax:  [phone]  Occupational Therapy Treatment  Patient Details  Name: Gavin Campbell MRN: [id number] Date of Birth: 1935-04-30 Referring Provider (OT): Dr. Ellouise Newer   Encounter Date: 10/20/2018  OT End of Session - 10/20/18 0846    Visit Number  4    Number of Visits  17    Date for OT Re-Evaluation  11/29/18    Authorization Type  [employer]--awaiting insurance auth    Authorization Time Period  cert. 09/29/18-12/28/18    Authorization - Visit Number  4    Authorization - Number of Visits  10    OT Start Time  [phone]    OT Stop Time  0915    OT Time Calculation (min)  41 min    Activity Tolerance  Patient tolerated treatment well    Behavior During Therapy  [facility] for tasks assessed/performed;Flat affect       Past Medical History:  Diagnosis Date  . Blurred vision   . Chest pain 2007   ABNORMAL CARDIOLITE   . Coronary arteriosclerosis in native artery   . Diplopia   . Early satiety   . Hyperlipidemia   . Hypertension   . Left ventricular systolic dysfunction   . Orthostatic dizziness   . Parotid mass   . Pure hypercholesterolemia   . Unsteady gait   . Venous stasis dermatitis of right lower extremity     Past Surgical History:  Procedure Laterality Date  . CORONARY ANGIOPLASTY  2009   NON OBSTRUCTIVE DISEASE    There were no vitals filed for this visit.  Subjective Assessment - 10/20/18 0845    Subjective   "Not as good as it could be I guess"--HEP.  "I feel so stiff, especially my neck and shoulders"    Pertinent History  Parkinsonism (diagnosed approx 6 months ago per pt); hypertension, hyperlipidemia, bradycardia, with mild dementia, diplopia    Limitations  diplopia, mild dementia    Patient Stated Goals  improve posture, stiffness, easier to dress    Currently in Pain?  No/denies        PWR! Moves in  modified quadraped (rock, twist) x20 each with mod cueing (verbal, visual, tactile) for incr movement amplitude.  PWR! Rock standing at counter to targets with min-mod cueing for incr movement amplitude.  Mod cueing for scooting up to table for large amplitude movement strategies.            OT Education - 10/20/18 [phone]    Education Details  Reviewed Coordination HEP    Person(s) Educated  Patient;Spouse    Methods  Explanation;Demonstration;Verbal cues;Tactile cues    Comprehension  Verbalized understanding;Returned demonstration;Verbal cues required;Need further instruction   min-mod cueing for each movement amplitude      OT Short Term Goals - 09/29/18 1539      OT SHORT TERM GOAL #1   Title  Pt will be independent with PD-specific HEP to address posture, bradykinesia, rigidity, and coordination.--check STGs 10/29/18    Time  4    Period  Weeks    Status  New      OT SHORT TERM GOAL #2   Title  Pt will verbalize understanding of ways to prevent future complications related to PD and appropriate community resources.    Time  4    Period  Weeks    Status  New      OT  SHORT TERM GOAL #3   Title  Pt will improve coordination as shown by fastening/unfastening 3 buttons in less than 59min.    Time  4    Period  Weeks    Status  New      OT SHORT TERM GOAL #4   Title  Pt will improve ability to dress as shown by completing PPT #4 in less than 20sec.    Time  4    Period  Weeks    Status  New      OT SHORT TERM GOAL #5   Title  Establish goal for standing functional reach as appropriate    Time  4    Period  Weeks    Status  New      OT SHORT TERM GOAL #6   Title  Pt will report incr ease with donning/doffing socks and pull-over shirt.    Time  4    Period  Weeks    Status  New        OT Long Term Goals - 09/29/18 1544      OT LONG TERM GOAL #1   Title  Pt will verbalize understanding of adaptive stratgies to incr. safety/ease/independence with  ADLs/IADLs.--check LTGs 11/29/18    Time  8    Period  Weeks    Status  New      OT LONG TERM GOAL #2   Title  Pt will verbalize understanding of memory compensation strategies and ways to keep thinking skills sharp.    Time  8    Period  Weeks    Status  New      OT LONG TERM GOAL #3   Title  Pt will improve functional reaching/coordination for ADLs as shown by improving score on box and blocks test by at least 6 blocks with dominant RUE.    Baseline  R-27 blocks, L-38 blocks    Time  8    Period  Weeks    Status  New      OT LONG TERM GOAL #4   Title  Pt will improve coordination for ADLs as shown by improving time on 9-hole peg test by at least 3sec bilaterally.    Baseline  R-35.56sec, L-34.97sec    Time  8    Period  Weeks    Status  New      OT LONG TERM GOAL #5   Title  Pt will be able to fasten/unfasten 3 buttons in less than 90sec.    Baseline  60min, 5sec    Time  8    Period  Weeks    Status  New            Plan - 10/20/18 0847    Clinical Impression Statement  Pt needs repetition and mod cueing for incr movement amplitude.  Pt progressing slowly towards goals.   Cognitive deficits and rigidity are barriers.    Occupational Profile and client history currently impacting functional performance  Pt was independent, working as a Art gallery manager, and driving until approx 4 months ago.  Pt is now mod I with most ADLs, but receiving some assist due to effort required.  Pt is no longer working or driving.  Wife has also started doing financial management tasks.  Pt is less active socially since he stopped working and no longer walks in neighborhood like he did.    Occupational performance deficits (Please refer to evaluation for details):  ADL's;IADL's;Social Participation;Leisure;Work    Nurse, adult  Potential  Good    Current Impairments/barriers affecting progress:  mild dementia    OT Frequency  2x / week    OT Duration  8 weeks    OT Treatment/Interventions  Self-care/ADL  training;Therapeutic exercise;Visual/perceptual remediation/compensation;Patient/family education;Neuromuscular education;Paraffin;Moist Heat;Aquatic Therapy;Fluidtherapy;Energy conservation;Therapist, nutritional;Therapeutic activities;Balance training;Cryotherapy;Ultrasound;DME and/or AE instruction;Manual Therapy;Passive range of motion;Cognitive remediation/compensation    Plan  ADL strategies, functional reaching, PWR! moves as warm-up    Recommended Other Services  physical therapy--requested order    Consulted and Agree with Plan of Care  Patient;Family member/caregiver    Family Member Consulted  spouse       Patient will benefit from skilled therapeutic intervention in order to improve the following deficits and impairments:  Decreased cognition, Decreased knowledge of use of DME, Impaired flexibility, Pain, Decreased mobility, Decreased coordination, Improper spinal/pelvic alignment, Impaired tone, Decreased strength, Decreased range of motion, Decreased activity tolerance, Decreased endurance, Decreased balance, Decreased knowledge of precautions, Impaired UE functional use  Visit Diagnosis: Other symptoms and signs involving the musculoskeletal system  Other lack of coordination  Other symptoms and signs involving the nervous system  Attention and concentration deficit  Frontal lobe and executive function deficit  Abnormal posture  Other abnormalities of gait and mobility  Unsteadiness on feet  Visuospatial deficit    Problem List Patient Active Problem List   Diagnosis Date Noted  . Hyperlipidemia 08/12/2018  . Mild dementia ([place]) 05/24/2017  . Chest pain 04/03/2017  . Mass of parotid gland 11/29/2015    [facility] 10/20/2018, 9:15 AM  [facility] [address] [place], [place], [address] Phone: [phone]   Fax:  [phone]  Name: Gavin Campbell MRN: [id number] Date of Birth:  [birth year]   Vianne Bulls, OTR/L [facility] [address] [place], [place]  [address] [phone] phone [phone] 10/20/18 9:15 AM

## 2018-10-20 NOTE — Patient Instructions (Addendum)
Access Code: [email or web address]  URL: [email or web address]  Date: 10/20/2018  Prepared by: Rudell Cobb   Program Notes  Walking: Begin "commercial break walking" by getting up once during a show and walking from the den to the kitchen (around the house) for a few minutes.   Exercises Sit to Stand - 5 reps - 2 sets - 2x daily - 7x weekly Seated Hamstring Stretch - 3 reps - 1 sets - 30 seconds hold - 2x daily - 7x weekly

## 2018-10-21 NOTE — Therapy (Signed)
[facility] [address] [place], [place], [address] Phone: [phone]   Fax:  [phone]  Physical Therapy Evaluation  Patient Details  Name: Gavin Campbell MRN: [id number] Date of Birth: [birth year] Referring Provider (PT): Dr. Ellouise Newer   Encounter Date: 10/20/2018  PT End of Session - 10/20/18 1207    Visit Number  1    Number of Visits  16    Date for PT Re-Evaluation  12/19/18    Authorization Type  [employer]    PT Start Time  0935    PT Stop Time  1018    PT Time Calculation (min)  43 min    Equipment Utilized During Treatment  Gait belt    Activity Tolerance  Patient tolerated treatment well    Behavior During Therapy  [facility] for tasks assessed/performed       Past Medical History:  Diagnosis Date  . Blurred vision   . Chest pain 2007   ABNORMAL CARDIOLITE   . Coronary arteriosclerosis in native artery   . Diplopia   . Early satiety   . Hyperlipidemia   . Hypertension   . Left ventricular systolic dysfunction   . Orthostatic dizziness   . Parotid mass   . Pure hypercholesterolemia   . Unsteady gait   . Venous stasis dermatitis of right lower extremity     Past Surgical History:  Procedure Laterality Date  . CORONARY ANGIOPLASTY  2009   NON OBSTRUCTIVE DISEASE      10/20/18 9373  Symptoms/Limitations  Subjective The patient is referred with Parkinsonism and notes he gets stiffness in his shoulders and neck that make mobility harder in the morning.  He denies falls and notes he can walk without a cane.  He carries a cane at times when he is out of the house.    Pertinent History HTN, hyperlipidemia, bradycardia, mild dementia, diplopia  Patient Stated Goals "Walk without bending over".    Pain Assessment  Currently in Pain? No/denies ("it pops, but it doesn't hurt" (in his neck).  Also reports some discomfort early in the morning in the shoulder (right).)   [facility] PT Assessment -  10/20/18 1957      Assessment   Medical Diagnosis  Unsteadiness, Parkinsonism    Referring Provider (PT)  Dr. Ellouise Newer    Hand Dominance  Right    Prior Therapy  none      Precautions   Precautions  None    Precaution Comments  notes no falls      Restrictions   Weight Bearing Restrictions  No      Balance Screen   Has the patient fallen in the past 6 months  No    Has the patient had a decrease in activity level because of a fear of falling?   No    Is the patient reluctant to leave their home because of a fear of falling?   No      Home Environment   Living Environment  Private residence    Living Arrangements  Spouse/significant other    Type of [place] to enter    Entrance Stairs-Number of Steps  8    Entrance Stairs-Rails  Can reach both    [place] - single point    Additional Comments  "I carry a cane more than I use it."  Prior Function   Level of Independence  Independent   does not drive at this time per report     Cognition   Overall Cognitive Status  History of cognitive impairments - at baseline   per diagnosis of mild dementia     Posture/Postural Control   Posture/Postural Control  Postural limitations    Postural Limitations  Rounded Shoulders;Forward head;Increased thoracic kyphosis      ROM / Strength   AROM / PROM / Strength  AROM;Strength      AROM   Overall AROM   Deficits    AROM Assessment Site  Cervical    Cervical Flexion  29   starting point of 20 degrees flexion at resting position   Cervical Extension  -18   starting point of 20 degrees of flexion resting position   Cervical - Right Side Bend  9    Cervical - Left Side Bend  6    Cervical - Right Rotation  52    Cervical - Left Rotation  46      Strength   Overall Strength  Deficits    Strength Assessment Site  Shoulder;Elbow;Hip;Knee;Ankle    Right Shoulder Flexion  5/5    Right Shoulder ABduction  5/5     Left Shoulder Flexion  5/5    Left Shoulder ABduction  5/5    Right Elbow Flexion  5/5    Left Elbow Flexion  5/5    Right Hip Flexion  5/5    Left Hip Flexion  5/5    Right Knee Flexion  5/5    Right Knee Extension  5/5    Left Knee Flexion  5/5    Left Knee Extension  5/5    Right Ankle Dorsiflexion  5/5    Left Ankle Dorsiflexion  5/5      Flexibility   Soft Tissue Assessment /Muscle Length  yes    Hamstrings  rightness noted bilateral hamstrings- patient is unable to full extend knees in seated position during MMT.        Transfers   Transfers  Sit to Stand;Stand to Sit    Sit to Stand  6: Modified independent (Device/Increase time);With upper extremity assist;Multiple attempts    Five time sit to stand comments   36.15 seconds on mat using bilateral UEs.    Stand to Sit  7: Independent      Ambulation/Gait   Ambulation/Gait  Yes    Ambulation/Gait Assistance  7: Independent    Ambulation Distance (Feet)  250 Feet    Assistive device  None    Gait Pattern  Step-through pattern;Decreased arm swing - right;Decreased arm swing - left;Decreased step length - right;Decreased step length - left;Decreased stride length;Trunk flexed;Decreased trunk rotation;Left flexed knee in stance;Right flexed knee in stance;Left foot flat;Right foot flat    Gait velocity  2.16 ft/sec    Stairs  Yes    Stairs Assistance  6: Modified independent (Device/Increase time)    Stair Management Technique  Two rails;Alternating pattern    Number of Stairs  4   patient takes 18.28 seconds to ascend, turn and descend 4      [facility] Adult PT Treatment/Exercise - 10/20/18 1957      Exercises   Exercises  Neck;Knee/Hip      Neck Exercises: Supine   Other Supine Exercise  Worked on gentle retraction during supine as patient notes he sleeps on one pillow, but head remains flexed and 2 hospital pillows do not touch  the back of his head in supine position.  PT provided tactiel cues to "gently press back into  my hands" and did contract/relax.    Other Supine Exercise  Seated shoulder rolls,       Knee/Hip Exercises: Stretches   Active Hamstring Stretch  3 reps;30 seconds;Right;Left      Knee/Hip Exercises: Standing   Other Standing Knee Exercises  sit<>stand emphasizing large amplitude movement and exaggerated upright posture x 5 reps.                      Objective measurements completed on examination: See above findings.              PT Education - 10/20/18 2057    Education Details  home program per [employer]) Educated  Patient;Spouse    Methods  Explanation;Demonstration;Handout    Comprehension  Verbalized understanding;Returned demonstration       PT Short Term Goals - 10/21/18 1949      PT SHORT TERM GOAL #1   Title  The patient will return demo HEP with assist from family (emphasizing posture, flexibility, large amplitude movements).    Time  4    Period  Weeks    Target Date  11/20/18      PT SHORT TERM GOAL #2   Title  The patient will improve gait speed from 2.16 ft/sec to > or equa lto 2.62 ft/sec to demo transition to "full community ambulator" classification of gait.    Time  4    Period  Weeks    Target Date  11/20/18      PT SHORT TERM GOAL #3   Title  The patient will perform sit<>stand x 5 reps without UE support demonstrating improved initiation and power for sit<>stand.    Time  4    Period  Weeks    Target Date  11/20/18      PT SHORT TERM GOAL #4   Title  The patient will subjective report decreased stiffness in the morning with neck stretching.    Time  4    Period  Weeks    Target Date  11/20/18      PT SHORT TERM GOAL #5   Title  The patient will be further assessed on Berg and goal to follow.    Time  4    Period  Weeks    Target Date  11/20/18      PT Long Term Goals - 10/21/18 1952      PT LONG TERM GOAL #1   Title  The patient will perform HEP progression with assist from family.    Time  8     Period  Weeks    Target Date  12/20/18      PT LONG TERM GOAL #2   Title  The patient will improve 5 time sit<>stand to < or equal to 25 seconds to demo improving transitional movements.    Time  8    Period  Weeks    Target Date  12/20/18      PT LONG TERM GOAL #3   Title  The patient will improve time to negotiate 4 steps from 18.28 seconds to < or equal to 14 seconds to demo improving functional mobility.    Time  8    Period  Weeks    Target Date  12/20/18      PT LONG TERM GOAL #4   Title  The  patient will improve total neck ROM for flexion + extension from 11 degrees up to 20 degrees of total movement.    Time  8    Period  Weeks    Target Date  12/20/18      PT LONG TERM GOAL #5   Title  The patient will participate in home walking program with family for continued activity.    Time  8    Period  Weeks    Target Date  12/20/18      Additional Long Term Goals   Additional Long Term Goals  Yes      PT LONG TERM GOAL #6   Title  LTG to follow for Berg.    Time  8    Period  Weeks    Target Date  12/20/18              Plan - 10/20/18 2107    Clinical Impression Statement  The patient is an 84 year old male presenting to outpatient physical therapy for abnormalities of gait, unsteadiness, and general decrease in mobilty.  His cc: is of neck stiffness in the morning.  PT performed assessment of neck AROM, posture, as well as flexibility, strength, and general mobility.  Will complete further balance assessment at next visit.  The patient presents with imapirments in neck AROM/PROM, postural shortening with forward head + rounded shoulders position, tightness in hamstrings, slowed gait speed, slowed transitions from sit>stand.  PT to address deficits to optimize functional mobility.    History and Personal Factors relevant to plan of care:  HTN, hyperlipidemia, bradycardia, mild dementia, diplopia, declining mobility per report of wife     Clinical Presentation   Evolving    Clinical Presentation due to:  Patient notes increased difficulty getting up from furniture, no longer driving, and wife notes more sedentary    Clinical Decision Making  Moderate    Rehab Potential  Good    PT Frequency  2x / week    PT Duration  8 weeks    PT Treatment/Interventions  ADLs/Self Care Home Management;Therapeutic activities;Therapeutic exercise;Neuromuscular re-education;Gait training;Stair training;Functional mobility training;Patient/family education;Manual techniques    PT Next Visit Plan  Check initial HEP, Complete Berg and update STG/LTG,     Consulted and Agree with Plan of Care  Patient       Patient will benefit from skilled therapeutic intervention in order to improve the following deficits and impairments:  Abnormal gait, Decreased range of motion, Postural dysfunction, Decreased balance, Impaired flexibility  Visit Diagnosis: Other symptoms and signs involving the nervous system  Abnormal posture  Other abnormalities of gait and mobility  Unsteadiness on feet     Problem List Patient Active Problem List   Diagnosis Date Noted  . Hyperlipidemia 08/12/2018  . Mild dementia ([place]) 05/24/2017  . Chest pain 04/03/2017  . Mass of parotid gland 11/29/2015    Lavin Petteway, PT 10/21/2018, 7:55 PM  [facility] [address] [place], [place], [address] Phone: [phone]   Fax:  [phone]  Name: Gavin Campbell MRN: [id number] Date of Birth: 12/07/1934

## 2018-10-22 ENCOUNTER — Ambulatory Visit: Payer: Medicare PPO | Admitting: Physical Therapy

## 2018-10-22 ENCOUNTER — Encounter: Payer: Self-pay | Admitting: Physical Therapy

## 2018-10-22 ENCOUNTER — Ambulatory Visit: Payer: Medicare PPO | Admitting: Occupational Therapy

## 2018-10-22 DIAGNOSIS — R293 Abnormal posture: Secondary | ICD-10-CM

## 2018-10-22 DIAGNOSIS — R2681 Unsteadiness on feet: Secondary | ICD-10-CM | POA: Diagnosis not present

## 2018-10-22 DIAGNOSIS — R29898 Other symptoms and signs involving the musculoskeletal system: Secondary | ICD-10-CM

## 2018-10-22 DIAGNOSIS — R278 Other lack of coordination: Secondary | ICD-10-CM

## 2018-10-22 DIAGNOSIS — R41842 Visuospatial deficit: Secondary | ICD-10-CM | POA: Diagnosis not present

## 2018-10-22 DIAGNOSIS — R41840 Attention and concentration deficit: Secondary | ICD-10-CM

## 2018-10-22 DIAGNOSIS — R2689 Other abnormalities of gait and mobility: Secondary | ICD-10-CM | POA: Diagnosis not present

## 2018-10-22 DIAGNOSIS — R41844 Frontal lobe and executive function deficit: Secondary | ICD-10-CM | POA: Diagnosis not present

## 2018-10-22 DIAGNOSIS — R29818 Other symptoms and signs involving the nervous system: Secondary | ICD-10-CM | POA: Diagnosis not present

## 2018-10-22 NOTE — Therapy (Signed)
[facility] [address] [place], [place], [address] Phone: [phone]   Fax:  [phone]  Occupational Therapy Treatment  Patient Details  Name: Gavin Campbell MRN: [id number] Date of Birth: [birth year] Referring Provider (OT): Dr. Ellouise Newer   Encounter Date: 10/22/2018  OT End of Session - 10/22/18 1510    Visit Number  5    Number of Visits  17    Date for OT Re-Evaluation  11/29/18    Authorization Type  [employer]-    Authorization Time Period  cert. 09/29/18-12/28/18    Authorization - Visit Number  5    Authorization - Number of Visits  10    OT Start Time  1318    OT Stop Time  1400    OT Time Calculation (min)  42 min       Past Medical History:  Diagnosis Date  . Blurred vision   . Chest pain 2007   ABNORMAL CARDIOLITE   . Coronary arteriosclerosis in native artery   . Diplopia   . Early satiety   . Hyperlipidemia   . Hypertension   . Left ventricular systolic dysfunction   . Orthostatic dizziness   . Parotid mass   . Pure hypercholesterolemia   . Unsteady gait   . Venous stasis dermatitis of right lower extremity     Past Surgical History:  Procedure Laterality Date  . CORONARY ANGIOPLASTY  2009   NON OBSTRUCTIVE DISEASE    There were no vitals filed for this visit.  Subjective Assessment - 10/22/18 1320    Pertinent History  Parkinsonism (diagnosed approx 6 months ago per pt); hypertension, hyperlipidemia, bradycardia, with mild dementia, diplopia    Limitations  diplopia, mild dementia    Patient Stated Goals  improve posture, stiffness, easier to dress    Currently in Pain?  No/denies                  Treatment: seated and standing , dynamic functional overhead reaching with emphasis on large amplitude movements and upright posture, min-mod v.c         OT Treatment/ Education - 10/22/18 1509    Education Details  Reveiwed PWR! basic 4 in supine, min-mod  v.c, pt'ds wife observed, modified quadraped PWR! up and rock x 10 reps each, min v.c/ faciltation    Person(s) Educated  Patient;Spouse    Methods  Explanation;Demonstration;Verbal cues;Tactile cues    Comprehension  Verbalized understanding;Returned demonstration;Verbal cues required       OT Short Term Goals - 10/22/18 1511      OT SHORT TERM GOAL #1   Title  Pt will be independent with PD-specific HEP to address posture, bradykinesia, rigidity, and coordination.--check STGs 10/29/18    Time  4    Period  Weeks    Status  On-going      OT SHORT TERM GOAL #2   Title  Pt will verbalize understanding of ways to prevent future complications related to PD and appropriate community resources.    Time  4    Period  Weeks    Status  On-going      OT SHORT TERM GOAL #3   Title  Pt will improve coordination as shown by fastening/unfastening 3 buttons in less than 40min.    Time  4    Period  Weeks    Status  On-going      OT SHORT TERM GOAL #4   Title  Pt will  improve ability to dress as shown by completing PPT #4 in less than 20sec.    Time  4    Period  Weeks    Status  On-going      OT SHORT TERM GOAL #5   Title  Establish goal for standing functional reach as appropriate    Time  4    Period  Weeks    Status  On-going      OT SHORT TERM GOAL #6   Title  Pt will report incr ease with donning/doffing socks and pull-over shirt.    Time  4    Period  Weeks    Status  New        OT Long Term Goals - 09/29/18 1544      OT LONG TERM GOAL #1   Title  Pt will verbalize understanding of adaptive stratgies to incr. safety/ease/independence with ADLs/IADLs.--check LTGs 11/29/18    Time  8    Period  Weeks    Status  New      OT LONG TERM GOAL #2   Title  Pt will verbalize understanding of memory compensation strategies and ways to keep thinking skills sharp.    Time  8    Period  Weeks    Status  New      OT LONG TERM GOAL #3   Title  Pt will improve functional  reaching/coordination for ADLs as shown by improving score on box and blocks test by at least 6 blocks with dominant RUE.    Baseline  R-27 blocks, L-38 blocks    Time  8    Period  Weeks    Status  New      OT LONG TERM GOAL #4   Title  Pt will improve coordination for ADLs as shown by improving time on 9-hole peg test by at least 3sec bilaterally.    Baseline  R-35.56sec, L-34.97sec    Time  8    Period  Weeks    Status  New      OT LONG TERM GOAL #5   Title  Pt will be able to fasten/unfasten 3 buttons in less than 90sec.    Baseline  57min, 5sec    Time  8    Period  Weeks    Status  New            Plan - 10/22/18 1512    Clinical Impression Statement    Pt progressing slowly towards goals.   Cognitive deficits and rigidity are barriers.    Occupational Profile and client history currently impacting functional performance  Pt was independent, working as a Art gallery manager, and driving until approx 4 months ago.  Pt is now mod I with most ADLs, but receiving some assist due to effort required.  Pt is no longer working or driving.  Wife has also started doing financial management tasks.  Pt is less active socially since he stopped working and no longer walks in neighborhood like he did.    Occupational performance deficits (Please refer to evaluation for details):  ADL's;IADL's;Social Participation;Leisure;Work    Neurosurgeon    Current Impairments/barriers affecting progress:  mild dementia    OT Frequency  2x / week    OT Duration  8 weeks    OT Treatment/Interventions  Self-care/ADL training;Therapeutic exercise;Visual/perceptual remediation/compensation;Patient/family education;Neuromuscular education;Paraffin;Moist Heat;Aquatic Therapy;Fluidtherapy;Energy conservation;Therapist, nutritional;Therapeutic activities;Balance training;Cryotherapy;Ultrasound;DME and/or AE instruction;Manual Therapy;Passive range of motion;Cognitive remediation/compensation    Plan  check  standing  functional reach and set goal prn, ADL strategies    Recommended Other Services  physical therapy--requested order    Consulted and Agree with Plan of Care  Patient;Family member/caregiver    Family Member Consulted  spouse       Patient will benefit from skilled therapeutic intervention in order to improve the following deficits and impairments:  Decreased cognition, Decreased knowledge of use of DME, Impaired flexibility, Pain, Decreased mobility, Decreased coordination, Improper spinal/pelvic alignment, Impaired tone, Decreased strength, Decreased range of motion, Decreased activity tolerance, Decreased endurance, Decreased balance, Decreased knowledge of precautions, Impaired UE functional use  Visit Diagnosis: Other symptoms and signs involving the musculoskeletal system  Other lack of coordination  Other symptoms and signs involving the nervous system  Attention and concentration deficit  Frontal lobe and executive function deficit    Problem List Patient Active Problem List   Diagnosis Date Noted  . Hyperlipidemia 08/12/2018  . Mild dementia ([place]) 05/24/2017  . Chest pain 04/03/2017  . Mass of parotid gland 11/29/2015    RINE,KATHRYN 10/22/2018, 3:12 PM  [facility] [address] [place], [place], [address] Phone: [phone]   Fax:  [phone]  Name: Gavin Campbell MRN: [id number] Date of Birth: 08/09/1935

## 2018-10-22 NOTE — Therapy (Signed)
[facility] [address] [place], [place], [address] Phone: [phone]   Fax:  [phone]  Physical Therapy Treatment  Patient Details  Name: Gavin Campbell MRN: [id number] Date of Birth: 1934/12/23 Referring Provider (PT): Dr. Ellouise Newer   Encounter Date: 10/22/2018  PT End of Session - 10/22/18 1458    Visit Number  2    Number of Visits  16    Date for PT Re-Evaluation  12/19/18    Authorization Type  [employer]    PT Start Time  2637    PT Stop Time  1445    PT Time Calculation (min)  40 min    Activity Tolerance  Patient tolerated treatment well    Behavior During Therapy  [facility] for tasks assessed/performed       Past Medical History:  Diagnosis Date  . Blurred vision   . Chest pain 2007   ABNORMAL CARDIOLITE   . Coronary arteriosclerosis in native artery   . Diplopia   . Early satiety   . Hyperlipidemia   . Hypertension   . Left ventricular systolic dysfunction   . Orthostatic dizziness   . Parotid mass   . Pure hypercholesterolemia   . Unsteady gait   . Venous stasis dermatitis of right lower extremity     Past Surgical History:  Procedure Laterality Date  . CORONARY ANGIOPLASTY  2009   NON OBSTRUCTIVE DISEASE    There were no vitals filed for this visit.  Subjective Assessment - 10/22/18 1408    Subjective  Pt denies falls or change since last visit.    Pertinent History  HTN, hyperlipidemia, bradycardia, mild dementia, diplopia    Patient Stated Goals  "Walk without bending over".      Currently in Pain?  No/denies           [facility] Adult PT Treatment/Exercise - 10/22/18 0001      Transfers   Transfers  Sit to Stand;Stand to Sit    Sit to Stand  6: Modified independent (Device/Increase time);With upper extremity assist;Multiple attempts    Stand to Sit  7: Independent    Number of Reps  10 reps      Posture/Postural Control   Posture/Postural Control  Postural  limitations    Postural Limitations  Rounded Shoulders;Forward head;Increased thoracic kyphosis      Berg Balance Test   Sit to Stand  Able to stand without using hands and stabilize independently    Standing Unsupported  Able to stand safely 2 minutes    Sitting with Back Unsupported but Feet Supported on Floor or Stool  Able to sit safely and securely 2 minutes    Stand to Sit  Sits safely with minimal use of hands    Transfers  Able to transfer safely, minor use of hands    Standing Unsupported with Eyes Closed  Able to stand 10 seconds safely    Standing Ubsupported with Feet Together  Able to place feet together independently and stand 1 minute safely    From Standing, Reach Forward with Outstretched Arm  Can reach confidently >25 cm (10")    From Standing Position, Pick up Object from Floor  Able to pick up shoe safely and easily    From Standing Position, Turn to Look Behind Over each Shoulder  Turn sideways only but maintains balance    Turn 360 Degrees  Able to turn 360 degrees safely but slowly    Standing Unsupported, Alternately  Place Feet on Step/Stool  Able to stand independently and complete 8 steps >20 seconds    Standing Unsupported, One Foot in Front  Able to take small step independently and hold 30 seconds    Standing on One Leg  Unable to try or needs assist to prevent fall    Total Score  45      Knee/Hip Exercises: Stretches   Active Hamstring Stretch  Right;Left;3 reps;30 seconds    Active Hamstring Stretch Limitations  tried in seated as given in HEP.  Seated position encouraged pt to forward flex even more at head, neck and shoulders so revised to supine position with black theraband.  Provided black theraband to pt.             PT Education - 10/22/18 1456    Education Details  Revised HEP;Results of BERG    Person(s) Educated  Patient;Spouse    Methods  Explanation;Demonstration;Verbal cues;Handout    Comprehension  Verbalized understanding;Returned  demonstration;Need further instruction       PT Short Term Goals - 10/21/18 1949      PT SHORT TERM GOAL #1   Title  The patient will return demo HEP with assist from family (emphasizing posture, flexibility, large amplitude movements).    Time  4    Period  Weeks    Target Date  11/20/18      PT SHORT TERM GOAL #2   Title  The patient will improve gait speed from 2.16 ft/sec to > or equa lto 2.62 ft/sec to demo transition to "full community ambulator" classification of gait.    Time  4    Period  Weeks    Target Date  11/20/18      PT SHORT TERM GOAL #3   Title  The patient will perform sit<>stand x 5 reps without UE support demonstrating improved initiation and power for sit<>stand.    Time  4    Period  Weeks    Target Date  11/20/18      PT SHORT TERM GOAL #4   Title  The patient will subjective report decreased stiffness in the morning with neck stretching.    Time  4    Period  Weeks    Target Date  11/20/18      PT SHORT TERM GOAL #5   Title  The patient will be further assessed on Berg and goal to follow.    Time  4    Period  Weeks    Target Date  11/20/18        PT Long Term Goals - 10/21/18 1952      PT LONG TERM GOAL #1   Title  The patient will perform HEP progression with assist from family.    Time  8    Period  Weeks    Target Date  12/20/18      PT LONG TERM GOAL #2   Title  The patient will improve 5 time sit<>stand to < or equal to 25 seconds to demo improving transitional movements.    Time  8    Period  Weeks    Target Date  12/20/18      PT LONG TERM GOAL #3   Title  The patient will improve time to negotiate 4 steps from 18.28 seconds to < or equal to 14 seconds to demo improving functional mobility.    Time  8    Period  Weeks    Target Date  12/20/18      PT LONG TERM GOAL #4   Title  The patient will improve total neck ROM for flexion + extension from 11 degrees up to 20 degrees of total movement.    Time  8    Period  Weeks     Target Date  12/20/18      PT LONG TERM GOAL #5   Title  The patient will participate in home walking program with family for continued activity.    Time  8    Period  Weeks    Target Date  12/20/18      Additional Long Term Goals   Additional Long Term Goals  Yes      PT LONG TERM GOAL #6   Title  LTG to follow for Berg.    Time  8    Period  Weeks    Target Date  12/20/18            Plan - 10/22/18 1458    Clinical Impression Statement  Pt scored 45/56 on BERG.  Difficulty with bilateral hamstring stretch seated on edge of mat.  Revised HEP to perform hamstring stretch in supine.  Provided folder for pt for his exercises and instructed to bring back on each visit.  Continue PT per POC.    Rehab Potential  Good    PT Frequency  2x / week    PT Duration  8 weeks    PT Treatment/Interventions  ADLs/Self Care Home Management;Therapeutic activities;Therapeutic exercise;Neuromuscular re-education;Gait training;Stair training;Functional mobility training;Patient/family education;Manual techniques    PT Next Visit Plan  Primary PT to set BERG STG/LTG.  Follow up on supine hamstring stretch.  Initiate PWR! moves in seated and standing positions.    PT Home Exercise Plan  [id number] in [place] and Agree with Plan of Care  Patient;Family member/caregiver   wife      Patient will benefit from skilled therapeutic intervention in order to improve the following deficits and impairments:  Abnormal gait, Decreased range of motion, Postural dysfunction, Decreased balance, Impaired flexibility  Visit Diagnosis: Other symptoms and signs involving the nervous system  Abnormal posture  Other abnormalities of gait and mobility  Unsteadiness on feet     Problem List Patient Active Problem List   Diagnosis Date Noted  . Hyperlipidemia 08/12/2018  . Mild dementia ([place]) 05/24/2017  . Chest pain 04/03/2017  . Mass of parotid gland 11/29/2015    Narda Bonds,  PTA [place] 10/22/18 3:03 PM Phone: [phone] Fax: [facility] [address] [place], [place], [address] Phone: [phone]   Fax:  [phone]  Name: Gavin Campbell MRN: [id number] Date of Birth: July 28, 1935

## 2018-10-22 NOTE — Patient Instructions (Signed)
Access Code: [email or web address]  URL: [email or web address]  Date: 10/22/2018  Prepared by: Nita Sells   Program Notes  Walking: Begin "commercial break walking" by getting up once during a show and walking from the den to the kitchen (around the house) for a few minutes.   Exercises  Sit to Stand - 5 reps - 2 sets - 2x daily - 7x weekly  Supine Hamstring Stretch with Strap - 3 reps - 30 seconds hold - 2x daily - 7x weekly

## 2018-10-27 ENCOUNTER — Ambulatory Visit: Payer: Medicare PPO | Admitting: Occupational Therapy

## 2018-10-27 ENCOUNTER — Ambulatory Visit: Payer: Medicare PPO | Admitting: Physical Therapy

## 2018-10-29 ENCOUNTER — Ambulatory Visit: Payer: Medicare PPO | Admitting: Occupational Therapy

## 2018-10-29 ENCOUNTER — Ambulatory Visit: Payer: Medicare PPO | Admitting: Physical Therapy

## 2018-11-05 ENCOUNTER — Ambulatory Visit: Payer: Medicare PPO | Admitting: Occupational Therapy

## 2018-11-05 ENCOUNTER — Encounter: Payer: Self-pay | Admitting: Physical Therapy

## 2018-11-05 ENCOUNTER — Encounter: Payer: Self-pay | Admitting: Occupational Therapy

## 2018-11-05 ENCOUNTER — Ambulatory Visit: Payer: Medicare PPO | Admitting: Physical Therapy

## 2018-11-05 DIAGNOSIS — R41842 Visuospatial deficit: Secondary | ICD-10-CM

## 2018-11-05 DIAGNOSIS — R2681 Unsteadiness on feet: Secondary | ICD-10-CM

## 2018-11-05 DIAGNOSIS — R278 Other lack of coordination: Secondary | ICD-10-CM

## 2018-11-05 DIAGNOSIS — R2689 Other abnormalities of gait and mobility: Secondary | ICD-10-CM | POA: Diagnosis not present

## 2018-11-05 DIAGNOSIS — R41840 Attention and concentration deficit: Secondary | ICD-10-CM

## 2018-11-05 DIAGNOSIS — R293 Abnormal posture: Secondary | ICD-10-CM

## 2018-11-05 DIAGNOSIS — R29818 Other symptoms and signs involving the nervous system: Secondary | ICD-10-CM

## 2018-11-05 DIAGNOSIS — R29898 Other symptoms and signs involving the musculoskeletal system: Secondary | ICD-10-CM | POA: Diagnosis not present

## 2018-11-05 DIAGNOSIS — R41844 Frontal lobe and executive function deficit: Secondary | ICD-10-CM

## 2018-11-05 NOTE — Patient Instructions (Signed)
Exercise for stooped posture    Stand, back to wall with head, shoulders, buttocks and heels all touching wall. Hold position 1 minute, then take two steps away from wall. Step back to wall and correct position if needed. Repeat 4 times. Do 2 sessions per day.  http://gt2.exer.us/687   Copyright  VHI. All rights reserved.

## 2018-11-05 NOTE — Therapy (Signed)
[facility] [address] [place], [place], [address] Phone: [phone]   Fax:  [phone]  Physical Therapy Treatment  Patient Details  Name: Gavin Campbell MRN: [id number] Date of Birth: 09-13-1935 Referring Provider (PT): Dr. Ellouise Newer   Encounter Date: 11/05/2018  PT End of Session - 11/05/18 1526    Visit Number  3    Number of Visits  16    Date for PT Re-Evaluation  12/19/18    Authorization Type  [employer]    PT Start Time  1320    PT Stop Time  1400    PT Time Calculation (min)  40 min    Activity Tolerance  Patient tolerated treatment well    Behavior During Therapy  [facility] for tasks assessed/performed       Past Medical History:  Diagnosis Date  . Blurred vision   . Chest pain 2007   ABNORMAL CARDIOLITE   . Coronary arteriosclerosis in native artery   . Diplopia   . Early satiety   . Hyperlipidemia   . Hypertension   . Left ventricular systolic dysfunction   . Orthostatic dizziness   . Parotid mass   . Pure hypercholesterolemia   . Unsteady gait   . Venous stasis dermatitis of right lower extremity     Past Surgical History:  Procedure Laterality Date  . CORONARY ANGIOPLASTY  2009   NON OBSTRUCTIVE DISEASE    There were no vitals filed for this visit.  Subjective Assessment - 11/05/18 1323    Subjective  Pt reports he is doing "much better" than he was.  Feels like exercises are helping and his step length has improved.    Pertinent History  HTN, hyperlipidemia, bradycardia, mild dementia, diplopia    Patient Stated Goals  "Walk without bending over".      Currently in Pain?  No/denies       [facility] Adult PT Treatment/Exercise - 11/05/18 0001      Bed Mobility   Bed Mobility  Supine to Sit;Sit to Supine    Supine to Sit  Supervision/Verbal cueing    Sit to Supine  Supervision/Verbal cueing      Transfers   Transfers  Sit to Stand;Stand to Sit    Sit to Stand  6: Modified  independent (Device/Increase time);With upper extremity assist;Multiple attempts    Stand to Sit  7: Independent      Posture/Postural Control   Posture/Postural Control  Postural limitations    Postural Limitations  Rounded Shoulders;Forward head;Increased thoracic kyphosis;Posterior pelvic tilt    Posture Comments  Performed standing at wall for upright posture x 2 minutes x 2 reps.  Provided handout in pt instructions for pt to perform as part of his HEP.      Knee/Hip Exercises: Stretches   Active Hamstring Stretch  Both;3 reps;30 seconds    Active Hamstring Stretch Limitations  Supine with pt using gait belt around foot for stretch.    Passive Hamstring Stretch  Both;3 reps;Other (comment)   contract/relax by PTA       [facility]) - 11/05/18 1517    PWR! exercises  Moves in sitting    PWR! Up  x 20    PWR! Rock  x 20    PWR! Twist  x 20    PWR! Step  x 20    Comments  Verbal, tactile and visual cues for technique and intensity.          PT  Education - 11/05/18 1524    Education Details  Standing posture at wall, Reviewed seated PWR! and supine hamstring stretch    Person(s) Educated  Patient;Spouse    Methods  Explanation;Demonstration;Verbal cues;Handout    Comprehension  Verbalized understanding;Need further instruction   needs continued reinforcement      PT Short Term Goals - 10/21/18 1949      PT SHORT TERM GOAL #1   Title  The patient will return demo HEP with assist from family (emphasizing posture, flexibility, large amplitude movements).    Time  4    Period  Weeks    Target Date  11/20/18      PT SHORT TERM GOAL #2   Title  The patient will improve gait speed from 2.16 ft/sec to > or equa lto 2.62 ft/sec to demo transition to "full community ambulator" classification of gait.    Time  4    Period  Weeks    Target Date  11/20/18      PT SHORT TERM GOAL #3   Title  The patient will perform sit<>stand x 5 reps without UE support demonstrating improved  initiation and power for sit<>stand.    Time  4    Period  Weeks    Target Date  11/20/18      PT SHORT TERM GOAL #4   Title  The patient will subjective report decreased stiffness in the morning with neck stretching.    Time  4    Period  Weeks    Target Date  11/20/18      PT SHORT TERM GOAL #5   Title  The patient will be further assessed on Berg and goal to follow.    Time  4    Period  Weeks    Target Date  11/20/18        PT Long Term Goals - 10/21/18 1952      PT LONG TERM GOAL #1   Title  The patient will perform HEP progression with assist from family.    Time  8    Period  Weeks    Target Date  12/20/18      PT LONG TERM GOAL #2   Title  The patient will improve 5 time sit<>stand to < or equal to 25 seconds to demo improving transitional movements.    Time  8    Period  Weeks    Target Date  12/20/18      PT LONG TERM GOAL #3   Title  The patient will improve time to negotiate 4 steps from 18.28 seconds to < or equal to 14 seconds to demo improving functional mobility.    Time  8    Period  Weeks    Target Date  12/20/18      PT LONG TERM GOAL #4   Title  The patient will improve total neck ROM for flexion + extension from 11 degrees up to 20 degrees of total movement.    Time  8    Period  Weeks    Target Date  12/20/18      PT LONG TERM GOAL #5   Title  The patient will participate in home walking program with family for continued activity.    Time  8    Period  Weeks    Target Date  12/20/18      Additional Long Term Goals   Additional Long Term Goals  Yes  PT LONG TERM GOAL #6   Title  LTG to follow for Berg.    Time  8    Period  Weeks    Target Date  12/20/18            Plan - 11/05/18 1526    Clinical Impression Statement  Skilled session focused on exercises for stretching, posture and functional mobility.  Pt continues to need moderate cues for posture and intensity.  Continue PT per POC.    Rehab Potential  Good    PT  Frequency  2x / week    PT Duration  8 weeks    PT Treatment/Interventions  ADLs/Self Care Home Management;Therapeutic activities;Therapeutic exercise;Neuromuscular re-education;Gait training;Stair training;Functional mobility training;Patient/family education;Manual techniques    PT Next Visit Plan  Primary PT to set BERG STG/LTG.  Anterior pelvic tilts for posture.  Standing PWR!  Gait working on step length and intensity.    PT Home Exercise Plan  [id number] in [place] and Agree with Plan of Care  Patient;Family member/caregiver   wife      Patient will benefit from skilled therapeutic intervention in order to improve the following deficits and impairments:  Abnormal gait, Decreased range of motion, Postural dysfunction, Decreased balance, Impaired flexibility  Visit Diagnosis: Other symptoms and signs involving the nervous system  Abnormal posture  Other abnormalities of gait and mobility  Unsteadiness on feet     Problem List Patient Active Problem List   Diagnosis Date Noted  . Hyperlipidemia 08/12/2018  . Mild dementia ([place]) 05/24/2017  . Chest pain 04/03/2017  . Mass of parotid gland 11/29/2015     [facility] [address] [place], [place], [address] Phone: [phone]   Fax:  [phone]  Name: Gavin Campbell MRN: [id number] Date of Birth: Mar 23, 1935

## 2018-11-05 NOTE — Patient Instructions (Addendum)
From [place]  The most potent medication for Parkinsons disease (PD) is levodopa. Its development in the late 1960s represents one of the most important breakthroughs in the history of medicine. Plain levodopa produces nausea and vomiting. It is combined with carbidopa to prevent this side effect. The well-known combined carbidopa/levodopa name brand formulation is called Sinemet. There are many different preparations and strengths of carbidopa/levodopa, including long-acting forms, a combined long and short-acting capsule called Rytary, a formulation that dissolves in the mouth without water, called Parcopa, and a combined formulation that includes the COMT inhibitor entacapone, called Stalevo. It is important that people with PD are aware which levodopa preparation they are taking because there are so many different pill sizes, strengths and manufacturers. Be careful when renewing prescriptions at the pharmacy because the accidental substitute of a different formulation may lead to an overdose or underdose. Carbidopa/levodopa remains the most effective drug to treat PD. The addition of carbidopa prevents levodopa from being converted into dopamine prematurely in the bloodstream, allowing more of it to get to the brain. Therefore, a smaller dose of levodopa is needed to treat symptoms. Some people with PD have been reluctant to take it, believing it to be a last resort. But most neurologists agree that delaying treatment too long is unwise and may put a person with PD at risk for falling and decreased optimal, consistent symptom benefit. The decision about when to start carbidopa/levodopa is different for every person with PD and requires consideration of potential benefits, risks and the availability of alternatives. Unfortunately, with time, patients experience other side effects including dyskinesias (spontaneous, involuntary movements) and "on-off" periods when the  medication will suddenly and unpredictably start or stop working. It is unclear whether this is a symptom of starting the medication at an advanced stage of PD or whether it is related to prolonged use of levodopa (although there is some published evidence for the former explanation). Check with a doctor before taking any of the following to avoid possible interactions: antacids, anti-seizure drugs, anti-hypertensives, anti-depressants and high protein food. The same drugs that interact with carbidopa/levodopa and entacapone interact with Stalevo.  What are the facts? The drug levodopa is synthesized in the brain into dopamine. It is the most important first-line drug for the management of Parkinson's.  Levodopa is almost always given in combination with the drug carbidopa, which prevents the nausea that can be caused by levodopa alone. Carbidopa is also a levodopa enhancer. When added, carbidopa enables a much lower dose of levodopa (80 percent less) and helps reduce the side effects of nausea and vomiting. Pills containing both drugs are often labeled carbidopa-levodopa, with the active components listed in alphabetical order.  Levodopa in pill form is absorbed in the blood from the small intestine and travels through the blood to the brain, where it is converted into dopamine, needed by the body for movement.  Carbidopa-levodopa tablets are available in immediate-release and slow-release forms as well as dissolvable tablets that are placed under the tongue.  Carbidopa-levodopa immediate/extended release combination capsules (Rytary) maintain levodopa concentrations longer than the immediate-release or other available oral levodopa formulations. Following an initial peak at about one hour, plasma levodopa concentrations are maintained for about four to five hours before declining. Clinical trials indicate that patients with motor fluctuations on other oral carbidopa-levodopa products may be able to  switch to Rytary and experience a reduction in off time while requiring fewer medication administrations. Carbidopa/levodopa can be taken with or without  food, but high fat meals may delay absorption. Dosages are not interchangeable with dosages other carbidopa-levodopa products. For more information relating to prescribing Rytary, please see How to Dose Carbidopa and Levodopa Extended-Release Capsules (Rytary), by Dr. Audree Camel. Whigh[place] of [place], a Ecologist.  Carbidopa/levodopa is also now available via a dopamine intestinal infusion pump (DUOPA), which provides 16 continuous hours of carbidopa and levodopa for motor symptoms. The small, portable infusion pump delivers carbidopa and levodopa directly into the small intestine. In a clinical trial, the amount of on time without troublesome dyskinesia was better in the pump group when compared to the placebo group (4.1 vs. 2.2 hours). One of the major drawbacks to the pump approach is the need for a percutaneous gastrojejunostomy (a small feeding tube). For more information relating to prescribing Duopa, please see Carbidopa/Levodopa Enteral Suspension (Duopa) by Luan Pulling, MD, and Winfield Cunas, PhD, of the [facility] of [place], a [place].  Common Side Effects Nausea  Vomiting  Loss of appetite  Lightheadedness  Lowered blood pressure  Confusion  Dyskinesia (if used as a long-term therapy; between three to five years)  People who use levodopa long term may experience dyskinesia at some point, usually three to five years after starting the medication.  The term dyskinesia describes involuntary, erratic, writhing movements of the face, arms, legs, and/or trunk, which usually occur one to two hours after a dose of levodopa has been absorbed into the bloodstream and is having its peak clinical effect.  Uncommon Side Effects Sleepiness, sudden onset sleep  Eating Proteins with  Levodopa/Sinemet* With more advanced PD, it is best to take Sinemet 30 to 60 minutes before eating a meal. This allows for quick absorption before food can interfere.  Take the Sinemet along with non-protein foods.  Ginger tea is a good choice for many people, because it often settles the stomach.  A graham or soda cracker along with ginger tea may help and are low in protein so should not interfere with the absorption of Sinemet.  If you cannot tolerate Sinemet because of nausea, upset stomach, you may need to actually take it with food. Caution: PD medications may have interactions with certain foods, other medications, vitamins, herbal supplements, over the counter cold pills and other remedies. Anyone taking a PD medication should talk to their doctor and pharmacist about potential drug interactions.                                          To put on jacket/coat  1.  Stand up tall with feet apart. 2.  Make sure collar is turned out. 3.  Push right arm in sleeve deliberately. 4.  Pull collar around back and then push in left arm.  Big push!  To take jacket off:  1.  Stand up tall with feet apart. 2.  Hold sides of jacket low using thumbs to pinch (don't hold in fingertips) 3.  Twist back/look back to the right to pull jacket off right shoulder. 4.  Twist back/look back to the left to pull jacket off left shoulder. 5.  Pull arms out the rest of the way by reaching behind you.

## 2018-11-05 NOTE — Therapy (Signed)
[facility] [address] [place], [place], [address] Phone: [phone]   Fax:  [phone]  Occupational Therapy Treatment  Patient Details  Name: Gavin Campbell MRN: [id number] Date of Birth: Nov 04, 1934 Referring Provider (OT): Dr. Ellouise Newer   Encounter Date: 11/05/2018  OT End of Session - 11/05/18 1408    Visit Number  6    Number of Visits  17    Date for OT Re-Evaluation  11/29/18    Authorization Type  [employer]-    Authorization Time Period  cert. 09/29/18-12/28/18    Authorization - Visit Number  6    Authorization - Number of Visits  10    OT Start Time  1406    OT Stop Time  1446    OT Time Calculation (min)  40 min       Past Medical History:  Diagnosis Date  . Blurred vision   . Chest pain 2007   ABNORMAL CARDIOLITE   . Coronary arteriosclerosis in native artery   . Diplopia   . Early satiety   . Hyperlipidemia   . Hypertension   . Left ventricular systolic dysfunction   . Orthostatic dizziness   . Parotid mass   . Pure hypercholesterolemia   . Unsteady gait   . Venous stasis dermatitis of right lower extremity     Past Surgical History:  Procedure Laterality Date  . CORONARY ANGIOPLASTY  2009   NON OBSTRUCTIVE DISEASE    There were no vitals filed for this visit.  Subjective Assessment - 11/05/18 1407    Subjective   was sick last week ?flu    Patient is accompained by:  Family member    Pertinent History  Parkinsonism (diagnosed approx 6 months ago per pt); hypertension, hyperlipidemia, bradycardia, with mild dementia, diplopia    Limitations  diplopia, mild dementia    Patient Stated Goals  improve posture, stiffness, easier to dress    Currently in Pain?  No/denies        [facility] OT Assessment - 11/05/18 0001      Observation/Other Assessments   Standing Functional Reach Test  R-6", L-7"     Pt instructed in strategies for Donning/doffing jacket including large  amplitude movements.  Pt needed significant repetition and verbal, demo, and tactile cueing, but improvement with repetition.  Pt issued written instructions for reinforcement at home (wife observed to provide cueing at home).  Pt instructed in strategy for donning overhead shirt including how PD affects movements for dressing. Pt instructed to gather shirt to head hole and then bring overhead first.  Pt returned demo gathering shirt.  Also discussed importance of posture.  Pt verbalized understanding.  Recommended that pt wear pullover shirt next session to further practice.      Sit>stand with min cueing for large amplitude movement strategies.       OT Education - 11/05/18 1500    Education Details  Education regarding timing of medication (spacing and regards to meals) based on [facility]--handout given.       Person(s) Educated  Patient;Spouse    Methods  Explanation    Comprehension  Verbalized understanding       OT Short Term Goals - 10/22/18 1511      OT SHORT TERM GOAL #1   Title  Pt will be independent with PD-specific HEP to address posture, bradykinesia, rigidity, and coordination.--check STGs 10/29/18    Time  4    Period  Weeks  Status  On-going      OT SHORT TERM GOAL #2   Title  Pt will verbalize understanding of ways to prevent future complications related to PD and appropriate community resources.    Time  4    Period  Weeks    Status  On-going      OT SHORT TERM GOAL #3   Title  Pt will improve coordination as shown by fastening/unfastening 3 buttons in less than 54min.    Time  4    Period  Weeks    Status  On-going      OT SHORT TERM GOAL #4   Title  Pt will improve ability to dress as shown by completing PPT #4 in less than 20sec.    Time  4    Period  Weeks    Status  On-going      OT SHORT TERM GOAL #5   Title  Establish goal for standing functional reach as appropriate    Time  4    Period  Weeks    Status  On-going      OT SHORT  TERM GOAL #6   Title  Pt will report incr ease with donning/doffing socks and pull-over shirt.    Time  4    Period  Weeks    Status  New        OT Long Term Goals - 09/29/18 1544      OT LONG TERM GOAL #1   Title  Pt will verbalize understanding of adaptive stratgies to incr. safety/ease/independence with ADLs/IADLs.--check LTGs 11/29/18    Time  8    Period  Weeks    Status  New      OT LONG TERM GOAL #2   Title  Pt will verbalize understanding of memory compensation strategies and ways to keep thinking skills sharp.    Time  8    Period  Weeks    Status  New      OT LONG TERM GOAL #3   Title  Pt will improve functional reaching/coordination for ADLs as shown by improving score on box and blocks test by at least 6 blocks with dominant RUE.    Baseline  R-27 blocks, L-38 blocks    Time  8    Period  Weeks    Status  New      OT LONG TERM GOAL #4   Title  Pt will improve coordination for ADLs as shown by improving time on 9-hole peg test by at least 3sec bilaterally.    Baseline  R-35.56sec, L-34.97sec    Time  8    Period  Weeks    Status  New      OT LONG TERM GOAL #5   Title  Pt will be able to fasten/unfasten 3 buttons in less than 90sec.    Baseline  31min, 5sec    Time  8    Period  Weeks    Status  New            Plan - 11/05/18 1501    Clinical Impression Statement  Pt is progressing slowly towards goals.  However, due to cognitive deficits, pt requires significant repetition and cues, but improvements noted with strategies for ADLs.  Pt seen for decr frequency due to illness and schedule conflict (funeral tomorrow and needs to cancel appt).      Occupational Profile and client history currently impacting functional performance  Pt was independent, working as a  barber, and driving until approx 4 months ago.  Pt is now mod I with most ADLs, but receiving some assist due to effort required.  Pt is no longer working or driving.  Wife has also started doing  financial management tasks.  Pt is less active socially since he stopped working and no longer walks in neighborhood like he did.    Occupational performance deficits (Please refer to evaluation for details):  ADL's;IADL's;Social Participation;Leisure;Work    Neurosurgeon    Current Impairments/barriers affecting progress:  mild dementia    OT Frequency  2x / week    OT Duration  8 weeks    OT Treatment/Interventions  Self-care/ADL training;Therapeutic exercise;Visual/perceptual remediation/compensation;Patient/family education;Neuromuscular education;Paraffin;Moist Heat;Aquatic Therapy;Fluidtherapy;Energy conservation;Therapist, nutritional;Therapeutic activities;Balance training;Cryotherapy;Ultrasound;DME and/or AE instruction;Manual Therapy;Passive range of motion;Cognitive remediation/compensation    Plan  continue with ADLs strategies (donning/doffing pull-over shirt, jacket, shoes/socks)    Recommended Other Services  physical therapy--requested order    Consulted and Agree with Plan of Care  Patient;Family member/caregiver    Family Member Consulted  spouse       Patient will benefit from skilled therapeutic intervention in order to improve the following deficits and impairments:  Decreased cognition, Decreased knowledge of use of DME, Impaired flexibility, Pain, Decreased mobility, Decreased coordination, Improper spinal/pelvic alignment, Impaired tone, Decreased strength, Decreased range of motion, Decreased activity tolerance, Decreased endurance, Decreased balance, Decreased knowledge of precautions, Impaired UE functional use  Visit Diagnosis: Other symptoms and signs involving the musculoskeletal system  Other lack of coordination  Other symptoms and signs involving the nervous system  Attention and concentration deficit  Frontal lobe and executive function deficit  Abnormal posture  Other abnormalities of gait and mobility  Unsteadiness on  feet  Visuospatial deficit    Problem List Patient Active Problem List   Diagnosis Date Noted  . Hyperlipidemia 08/12/2018  . Mild dementia ([place]) 05/24/2017  . Chest pain 04/03/2017  . Mass of parotid gland 11/29/2015    [facility] 11/05/2018, 3:08 PM  [facility] [address] [place], [place], [address] Phone: [phone]   Fax:  [phone]  Name: Gavin Campbell MRN: [id number] Date of Birth: Mar 10, 1935   Vianne Bulls, OTR/L [facility] [address] [place], [place]  [address] [phone] phone [phone] 11/05/18 3:08 PM

## 2018-11-06 ENCOUNTER — Ambulatory Visit: Payer: Medicare PPO | Admitting: Physical Therapy

## 2018-11-06 ENCOUNTER — Ambulatory Visit: Payer: Medicare PPO | Admitting: Occupational Therapy

## 2018-11-10 ENCOUNTER — Ambulatory Visit: Payer: Medicare PPO | Admitting: Physical Therapy

## 2018-11-10 ENCOUNTER — Ambulatory Visit: Payer: Medicare PPO | Admitting: Occupational Therapy

## 2018-11-10 ENCOUNTER — Encounter: Payer: Self-pay | Admitting: Occupational Therapy

## 2018-11-10 ENCOUNTER — Encounter: Payer: Self-pay | Admitting: Physical Therapy

## 2018-11-10 DIAGNOSIS — R2689 Other abnormalities of gait and mobility: Secondary | ICD-10-CM

## 2018-11-10 DIAGNOSIS — R278 Other lack of coordination: Secondary | ICD-10-CM

## 2018-11-10 DIAGNOSIS — R2681 Unsteadiness on feet: Secondary | ICD-10-CM | POA: Diagnosis not present

## 2018-11-10 DIAGNOSIS — R293 Abnormal posture: Secondary | ICD-10-CM | POA: Diagnosis not present

## 2018-11-10 DIAGNOSIS — R29898 Other symptoms and signs involving the musculoskeletal system: Secondary | ICD-10-CM | POA: Diagnosis not present

## 2018-11-10 DIAGNOSIS — R41840 Attention and concentration deficit: Secondary | ICD-10-CM | POA: Diagnosis not present

## 2018-11-10 DIAGNOSIS — R29818 Other symptoms and signs involving the nervous system: Secondary | ICD-10-CM

## 2018-11-10 DIAGNOSIS — R41844 Frontal lobe and executive function deficit: Secondary | ICD-10-CM | POA: Diagnosis not present

## 2018-11-10 DIAGNOSIS — R41842 Visuospatial deficit: Secondary | ICD-10-CM

## 2018-11-10 NOTE — Patient Instructions (Addendum)
Keeping Thinking Skills Sharp:  1. Jigsaw puzzles 2. Card/board games 3. Talking on the phone/social events 4. Lumosity.com 5. Online games 6. Word serches/crossword puzzles 7.  Logic puzzles 8.  Safe Aerobic exercise (stationary bike) 9. Eating balanced diet (fruits & veggies) 10. Drink water 11. Try something new--new recipe, hobby 12. Crafts 13. Do a variety of activities that are challenging    Memory Compensation Strategies  1. Use "WARM" strategy. W= write it down A=  associate it R=  repeat it M=  make a mental picture  2. You can keep a Social worker. Use a 3-ring notebook with sections for the following:  calendar, important names and phone numbers, medications, doctors' names/phone numbers, "to do list"/reminders, and a section to journal what you did each day  3. Use a calendar to write appointments down.  4. Write yourself a schedule for the day/make a routine This can be placed on the calendar or in a separate section of the Memory Notebook.  Keeping a regular schedule can help memory.  5. Use medication organizer with sections for each day or morning/evening pills  You may need help loading it  6. Keep a basket, or pegboard by the door.   Place items that you need to take out with you in the basket or on the pegboard.  You may also want to include a message board for reminders.  7. Use sticky notes. Place sticky notes with reminders in a place where the task is performed.  For example:  "turn off the stove" placed by the stove, "lock the door" placed on the door at eye level, "take your medications" on the bathroom mirror or by the place where you normally take your medications  8. Use alarms/timers.  Use while cooking to remind yourself to check on food or as a reminder to take your medicine, or as a reminder to make a call, or as a reminder to perform another task, etc.  9. Use a voice memo to record important information and notes for yourself.  10.   Organize and reduce clutter

## 2018-11-10 NOTE — Therapy (Signed)
[facility] [address] [place], [place], [address] Phone: [phone]   Fax:  [phone]  Physical Therapy Treatment  Patient Details  Name: Gavin Campbell MRN: [id number] Date of Birth: [birth year] Referring Provider (PT): Dr. Ellouise Newer   Encounter Date: 11/10/2018  PT End of Session - 11/10/18 1314    Visit Number  4    Number of Visits  16    Date for PT Re-Evaluation  12/19/18    Authorization Type  [employer]    PT Start Time  2423    PT Stop Time  1405    PT Time Calculation (min)  50 min    Activity Tolerance  Patient tolerated treatment well    Behavior During Therapy  [facility] for tasks assessed/performed       Past Medical History:  Diagnosis Date  . Blurred vision   . Chest pain 2007   ABNORMAL CARDIOLITE   . Coronary arteriosclerosis in native artery   . Diplopia   . Early satiety   . Hyperlipidemia   . Hypertension   . Left ventricular systolic dysfunction   . Orthostatic dizziness   . Parotid mass   . Pure hypercholesterolemia   . Unsteady gait   . Venous stasis dermatitis of right lower extremity     Past Surgical History:  Procedure Laterality Date  . CORONARY ANGIOPLASTY  2009   NON OBSTRUCTIVE DISEASE    There were no vitals filed for this visit.  Subjective Assessment - 11/10/18 1314    Subjective  Finding he's only doing his exercises 2-3 days/week. Wife reports she tries to get pt to do his exercises and he refuses    Pertinent History  HTN, hyperlipidemia, bradycardia, mild dementia, diplopia    Patient Stated Goals  "Walk without bending over".      Currently in Pain?  No/denies                       [facility] Adult PT Treatment/Exercise - 11/10/18 0001      Ambulation/Gait   Ambulation/Gait Assistance  5: Supervision    Ambulation/Gait Assistance Details  vc for focus on heelstrike (to improve foot clearance and step length); upright posture, shoulder  retraction, gaze forward    Ambulation Distance (Feet)  360 Feet    Assistive device  None    Gait Pattern  Step-through pattern;Decreased arm swing - right;Decreased arm swing - left;Decreased step length - right;Decreased step length - left;Decreased stride length;Trunk flexed;Decreased trunk rotation;Poor foot clearance - left;Poor foot clearance - right      Posture/Postural Control   Posture/Postural Control  Postural limitations    Postural Limitations  Rounded Shoulders;Forward head;Increased thoracic kyphosis;Posterior pelvic tilt    Posture Comments  in supine, prior to supine PWR! worked on using as little pillow under his head as necessary; Discussed goal is to be able to lie down flat wiht no pillows        PWR [facility]) - 11/10/18 1328    PWR! exercises  Moves in sitting;Moves in supine    PWR! Up  10    PWR! Rock  10    PWR! Twist  10    Comments  instead of step, tried to work on anterior pelvic tilt in hooklying with minimal results    PWR! Up  10    PWR! Rock  20    PWR! Twist  14    PWR! Step  10  Comments  max cues (verbal, tactile) even with pt using handout to try to recall how to do the exercises          PT Education - 11/10/18 1929    Education Details  reorganized his exercise notebook, removing duplicate ex's/handouts; reviewed frequency he should be completing exercises    Person(s) Educated  Patient;Spouse    Methods  Explanation;Demonstration;Tactile cues;Verbal cues;Handout    Comprehension  Verbalized understanding;Returned demonstration;Verbal cues required;Tactile cues required;Need further instruction       PT Short Term Goals - 11/10/18 1939      PT SHORT TERM GOAL #1   Title  The patient will return demo HEP with assist from family (emphasizing posture, flexibility, large amplitude movements).    Time  4    Period  Weeks    Status  On-going      PT SHORT TERM GOAL #2   Title  The patient will improve gait speed from 2.16 ft/sec to > or  equa lto 2.62 ft/sec to demo transition to "full community ambulator" classification of gait.    Time  4    Period  Weeks    Status  On-going      PT SHORT TERM GOAL #3   Title  The patient will perform sit<>stand x 5 reps without UE support demonstrating improved initiation and power for sit<>stand.    Time  4    Period  Weeks    Status  On-going      PT SHORT TERM GOAL #4   Title  The patient will subjective report decreased stiffness in the morning with neck stretching.    Time  4    Period  Weeks    Status  On-going      PT SHORT TERM GOAL #5   Title  The patient will be further assessed on Berg and goal to follow.    Baseline  45/56    Time  4    Period  Weeks    Status  Achieved        PT Long Term Goals - 11/10/18 1940      PT LONG TERM GOAL #1   Title  The patient will perform HEP progression with assist from family.    Time  8    Period  Weeks      PT LONG TERM GOAL #2   Title  The patient will improve 5 time sit<>stand to < or equal to 25 seconds to demo improving transitional movements.    Time  8    Period  Weeks      PT LONG TERM GOAL #3   Title  The patient will improve time to negotiate 4 steps from 18.28 seconds to < or equal to 14 seconds to demo improving functional mobility.    Time  8    Period  Weeks      PT LONG TERM GOAL #4   Title  The patient will improve total neck ROM for flexion + extension from 11 degrees up to 20 degrees of total movement.    Time  8    Period  Weeks      PT LONG TERM GOAL #5   Title  The patient will participate in home walking program with family for continued activity.    Time  8    Period  Weeks      PT LONG TERM GOAL #6   Title  Patient will improve Berg to >=48/56 to  show improved balance.     Time  8    Period  Weeks    Status  New            Plan - 11/10/18 1932    Clinical Impression Statement  Session focused on pt's understanding of PWR HEP (supine) and pt required max cues and assist for  technique. Educated on seated PWR exercises, however pt again needed step by step cues to perform. Noted pt did not have this handout in his HEP folder and chose not to add seated PWR! due to degree of assistance needed. Wife present throughout session and encouraged pt to do his exercises when his wife requests he does them. Will continue to focus on education of HEP and wife's ability to cue pt for correct form.     Clinical Presentation due to:       Rehab Potential  Good    PT Frequency  2x / week    PT Duration  8 weeks    PT Treatment/Interventions  ADLs/Self Care Home Management;Therapeutic activities;Therapeutic exercise;Neuromuscular re-education;Gait training;Stair training;Functional mobility training;Patient/family education;Manual techniques    PT Next Visit Plan   review ex's in HEP (he brings his folder) and can he use handout to perform correctly (doubtful) and educate wife on how to cue pt; Anterior pelvic tilts for posture.  ?try Standing PWR! (pt with great difficulty with seated) Gait working on step length and intensity.    PT Home Exercise Plan  [id number] in [place] and Agree with Plan of Care  Patient;Family member/caregiver   wife      Patient will benefit from skilled therapeutic intervention in order to improve the following deficits and impairments:  Abnormal gait, Decreased range of motion, Postural dysfunction, Decreased balance, Impaired flexibility  Visit Diagnosis: Other symptoms and signs involving the nervous system  Other abnormalities of gait and mobility  Abnormal posture     Problem List Patient Active Problem List   Diagnosis Date Noted  . Hyperlipidemia 08/12/2018  . Mild dementia ([place]) 05/24/2017  . Chest pain 04/03/2017  . Mass of parotid gland 11/29/2015    Rexanne Mano, PT 11/10/2018, 7:42 PM  [facility] [address] [place], [place], [address] Phone:  [phone]   Fax:  [phone]  Name: Gavin Campbell MRN: [id number] Date of Birth: Feb 19, 1935

## 2018-11-10 NOTE — Therapy (Signed)
[facility] [address] [place], [place], [address] Phone: [phone]   Fax:  [phone]  Occupational Therapy Treatment  Patient Details  Name: Gavin Campbell MRN: [id number] Date of Birth: 1935-07-25 Referring Provider (OT): Dr. Ellouise Newer   Encounter Date: 11/10/2018  OT End of Session - 11/10/18 1423    Visit Number  7    Number of Visits  17    Date for OT Re-Evaluation  11/29/18    Authorization Type  [employer]-    Authorization Time Period  cert. 09/29/18-12/28/18    Authorization - Visit Number  7    Authorization - Number of Visits  10    OT Start Time  1405    OT Stop Time  1445    OT Time Calculation (min)  40 min    Activity Tolerance  Patient tolerated treatment well    Behavior During Therapy  WFL for tasks assessed/performed       Past Medical History:  Diagnosis Date  . Blurred vision   . Chest pain 2007   ABNORMAL CARDIOLITE   . Coronary arteriosclerosis in native artery   . Diplopia   . Early satiety   . Hyperlipidemia   . Hypertension   . Left ventricular systolic dysfunction   . Orthostatic dizziness   . Parotid mass   . Pure hypercholesterolemia   . Unsteady gait   . Venous stasis dermatitis of right lower extremity     Past Surgical History:  Procedure Laterality Date  . CORONARY ANGIOPLASTY  2009   NON OBSTRUCTIVE DISEASE    There were no vitals filed for this visit.  Subjective Assessment - 11/10/18 1407    Subjective   Pt/wife reports improvement with taking medication 30-60min prior to meal.    Patient is accompained by:  Family member    Pertinent History  Parkinsonism (diagnosed approx 6 months ago per pt); hypertension, hyperlipidemia, bradycardia, with mild dementia, diplopia    Limitations  diplopia, mild dementia    Patient Stated Goals  improve posture, stiffness, easier to dress    Currently in Pain?  No/denies         In sitting, functional  reaching to place small pegs in vertical pegboard to copy design.  Reaching overhead with min-mod difficulty with coordination and reaching.  Min-mod cueing for copying design due to cognitive deficits.  Educated pt in importance of doing activities that challenge cognition as well as physical activity to reduce risk for future complications.  Reviewed strategies for Donning/doffing jacket including large amplitude movements.  Pt returned demo x 3 with min v.c. with improvement noted with repetition/cues.  Sit>stand with min cueing for large amplitude movement strategies x3.  Pt able to don/doff shoe/sock without significant difficulty.  Pt instructed to make sure that he opens hand and gets more material in hand before pulling clothing.  Pt verbalized understanding.      OT Education - 11/10/18 1434    Education Details  Keeping Thinking Skills [place], Memory Compensation Strategies    Person(s) Educated  Patient;Spouse    Methods  Explanation;Handout    Comprehension  Verbalized understanding       OT Short Term Goals - 11/10/18 1412      OT SHORT TERM GOAL #1   Title  Pt will be independent with PD-specific HEP to address posture, bradykinesia, rigidity, and coordination.--check STGs 10/29/18    Time  4    Period  Weeks  Status  On-going      OT SHORT TERM GOAL #2   Title  Pt will verbalize understanding of ways to prevent future complications related to PD and appropriate community resources.    Time  4    Period  Weeks    Status  On-going      OT SHORT TERM GOAL #3   Title  Pt will improve coordination as shown by fastening/unfastening 3 buttons in less than 58min.    Time  4    Period  Weeks    Status  On-going      OT SHORT TERM GOAL #4   Title  Pt will improve ability to dress as shown by completing PPT #4 in less than 20sec.    Time  4    Period  Weeks    Status  On-going   11/10/18:  32.97sec min v.c.     OT SHORT TERM GOAL #5   Title  Pt will improve standing  functional reach by at least 2" bilaterally for improved balance for ADLs.      Baseline  R-6", L-7"    Time  4    Period  Weeks    Status  On-going      OT SHORT TERM GOAL #6   Title  Pt will report incr ease with donning/doffing socks and pull-over shirt.    Time  4    Period  Weeks    Status  Achieved   11/10/18       OT Long Term Goals - 09/29/18 1544      OT LONG TERM GOAL #1   Title  Pt will verbalize understanding of adaptive stratgies to incr. safety/ease/independence with ADLs/IADLs.--check LTGs 11/29/18    Time  8    Period  Weeks    Status  New      OT LONG TERM GOAL #2   Title  Pt will verbalize understanding of memory compensation strategies and ways to keep thinking skills sharp.    Time  8    Period  Weeks    Status  New      OT LONG TERM GOAL #3   Title  Pt will improve functional reaching/coordination for ADLs as shown by improving score on box and blocks test by at least 6 blocks with dominant RUE.    Baseline  R-27 blocks, L-38 blocks    Time  8    Period  Weeks    Status  New      OT LONG TERM GOAL #4   Title  Pt will improve coordination for ADLs as shown by improving time on 9-hole peg test by at least 3sec bilaterally.    Baseline  R-35.56sec, L-34.97sec    Time  8    Period  Weeks    Status  New      OT LONG TERM GOAL #5   Title  Pt will be able to fasten/unfasten 3 buttons in less than 90sec.    Baseline  25min, 5sec    Time  8    Period  Weeks    Status  New            Plan - 11/10/18 1424    Clinical Impression Statement  Pt is progressing slowly towards goals.  Pt reports/demo incr ease with donning/doffing jacket, donning socks, and donning t-shirt with use of strategies.  Pt/wife also reports that they notice improvement with taking meds prior to meals.  Pt will  benefit from continued reinforcement due to cognitive deficits.    Occupational Profile and client history currently impacting functional performance  Pt was independent,  working as a Art gallery manager, and driving until approx 4 months ago.  Pt is now mod I with most ADLs, but receiving some assist due to effort required.  Pt is no longer working or driving.  Wife has also started doing financial management tasks.  Pt is less active socially since he stopped working and no longer walks in neighborhood like he did.    Occupational performance deficits (Please refer to evaluation for details):  ADL's;IADL's;Social Participation;Leisure;Work    Neurosurgeon    Current Impairments/barriers affecting progress:  mild dementia    OT Frequency  2x / week    OT Duration  8 weeks    OT Treatment/Interventions  Self-care/ADL training;Therapeutic exercise;Visual/perceptual remediation/compensation;Patient/family education;Neuromuscular education;Paraffin;Moist Heat;Aquatic Therapy;Fluidtherapy;Energy conservation;Therapist, nutritional;Therapeutic activities;Balance training;Cryotherapy;Ultrasound;DME and/or AE instruction;Manual Therapy;Passive range of motion;Cognitive remediation/compensation    Plan  continue checking STGs:  educated in appropriate community resources & ways to prevent future complications, continue with strategies for ADLs    Recommended Other Services  physical therapy--requested order    Consulted and Agree with Plan of Care  Patient;Family member/caregiver    Family Member Consulted  spouse       Patient will benefit from skilled therapeutic intervention in order to improve the following deficits and impairments:  Decreased cognition, Decreased knowledge of use of DME, Impaired flexibility, Pain, Decreased mobility, Decreased coordination, Improper spinal/pelvic alignment, Impaired tone, Decreased strength, Decreased range of motion, Decreased activity tolerance, Decreased endurance, Decreased balance, Decreased knowledge of precautions, Impaired UE functional use  Visit Diagnosis: Other symptoms and signs involving the nervous system  Other  abnormalities of gait and mobility  Other symptoms and signs involving the musculoskeletal system  Other lack of coordination  Attention and concentration deficit  Frontal lobe and executive function deficit  Abnormal posture  Unsteadiness on feet  Visuospatial deficit    Problem List Patient Active Problem List   Diagnosis Date Noted  . Hyperlipidemia 08/12/2018  . Mild dementia ([place]) 05/24/2017  . Chest pain 04/03/2017  . Mass of parotid gland 11/29/2015    [facility] 11/10/2018, 3:13 PM  [facility] [address] [place], [place], [address] Phone: [phone]   Fax:  [phone]  Name: Gavin Campbell MRN: [id number] Date of Birth: 1935/05/20   Vianne Bulls, OTR/L [facility] [address] [place], [place]  [address] [phone] phone [phone] 11/10/18 3:13 PM

## 2018-11-12 ENCOUNTER — Ambulatory Visit: Payer: Medicare PPO | Admitting: Occupational Therapy

## 2018-11-12 ENCOUNTER — Ambulatory Visit: Payer: Medicare PPO | Admitting: Physical Therapy

## 2018-11-12 DIAGNOSIS — R29818 Other symptoms and signs involving the nervous system: Secondary | ICD-10-CM | POA: Diagnosis not present

## 2018-11-12 DIAGNOSIS — R41840 Attention and concentration deficit: Secondary | ICD-10-CM | POA: Diagnosis not present

## 2018-11-12 DIAGNOSIS — R2689 Other abnormalities of gait and mobility: Secondary | ICD-10-CM | POA: Diagnosis not present

## 2018-11-12 DIAGNOSIS — R41844 Frontal lobe and executive function deficit: Secondary | ICD-10-CM

## 2018-11-12 DIAGNOSIS — R41842 Visuospatial deficit: Secondary | ICD-10-CM | POA: Diagnosis not present

## 2018-11-12 DIAGNOSIS — R2681 Unsteadiness on feet: Secondary | ICD-10-CM | POA: Diagnosis not present

## 2018-11-12 DIAGNOSIS — R293 Abnormal posture: Secondary | ICD-10-CM | POA: Diagnosis not present

## 2018-11-12 DIAGNOSIS — R278 Other lack of coordination: Secondary | ICD-10-CM

## 2018-11-12 DIAGNOSIS — R29898 Other symptoms and signs involving the musculoskeletal system: Secondary | ICD-10-CM

## 2018-11-13 NOTE — Therapy (Signed)
[facility] [address] [place], [place], [address] Phone: [phone]   Fax:  [phone]  Occupational Therapy Treatment  Patient Details  Name: Gavin Campbell MRN: [id number] Date of Birth: March 30, 1935 Referring Provider (OT): Dr. Ellouise Newer   Encounter Date: 11/12/2018  OT End of Session - 11/13/18 1309    Visit Number  8    Number of Visits  17    Date for OT Re-Evaluation  11/29/18    Authorization Type  [employer]-    Authorization Time Period  cert. 09/29/18-12/28/18    Authorization - Visit Number  8    Authorization - Number of Visits  10    OT Start Time  1320    OT Stop Time  1400    OT Time Calculation (min)  40 min    Activity Tolerance  Patient tolerated treatment well    Behavior During Therapy  WFL for tasks assessed/performed       Past Medical History:  Diagnosis Date  . Blurred vision   . Chest pain 2007   ABNORMAL CARDIOLITE   . Coronary arteriosclerosis in native artery   . Diplopia   . Early satiety   . Hyperlipidemia   . Hypertension   . Left ventricular systolic dysfunction   . Orthostatic dizziness   . Parotid mass   . Pure hypercholesterolemia   . Unsteady gait   . Venous stasis dermatitis of right lower extremity     Past Surgical History:  Procedure Laterality Date  . CORONARY ANGIOPLASTY  2009   NON OBSTRUCTIVE DISEASE    There were no vitals filed for this visit.  Subjective Assessment - 11/13/18 1308    Pertinent History  Parkinsonism (diagnosed approx 6 months ago per pt); hypertension, hyperlipidemia, bradycardia, with mild dementia, diplopia    Patient Stated Goals  improve posture, stiffness, easier to dress    Currently in Pain?  No/denies             Treatment: Reviewed PWR! Up, rock and twist in supine, min-mod v.c for larger amplitude movements 10 reps each. Seated simulated ADLS with bag exercises, simulated donning shirt, pants and pulling  up socks, 10 reps each, min-,mod v.c Standing against wall for upright posture bring head and neck back against a towel, min v.c               OT Education - 11/13/18 1308    Education Details  community resources    [employer]) Educated  Patient;Spouse    Methods  Explanation;Handout    Comprehension  Verbalized understanding       OT Short Term Goals - 11/10/18 1412      OT SHORT TERM GOAL #1   Title  Pt will be independent with PD-specific HEP to address posture, bradykinesia, rigidity, and coordination.--check STGs 10/29/18    Time  4    Period  Weeks    Status  On-going      OT SHORT TERM GOAL #2   Title  Pt will verbalize understanding of ways to prevent future complications related to PD and appropriate community resources.    Time  4    Period  Weeks    Status  On-going      OT SHORT TERM GOAL #3   Title  Pt will improve coordination as shown by fastening/unfastening 3 buttons in less than 64min.    Time  4    Period  Weeks  Status  On-going      OT SHORT TERM GOAL #4   Title  Pt will improve ability to dress as shown by completing PPT #4 in less than 20sec.    Time  4    Period  Weeks    Status  On-going   11/10/18:  32.97sec min v.c.     OT SHORT TERM GOAL #5   Title  Pt will improve standing functional reach by at least 2" bilaterally for improved balance for ADLs.      Baseline  R-6", L-7"    Time  4    Period  Weeks    Status  On-going      OT SHORT TERM GOAL #6   Title  Pt will report incr ease with donning/doffing socks and pull-over shirt.    Time  4    Period  Weeks    Status  Achieved   11/10/18       OT Long Term Goals - 09/29/18 1544      OT LONG TERM GOAL #1   Title  Pt will verbalize understanding of adaptive stratgies to incr. safety/ease/independence with ADLs/IADLs.--check LTGs 11/29/18    Time  8    Period  Weeks    Status  New      OT LONG TERM GOAL #2   Title  Pt will verbalize understanding of memory compensation  strategies and ways to keep thinking skills sharp.    Time  8    Period  Weeks    Status  New      OT LONG TERM GOAL #3   Title  Pt will improve functional reaching/coordination for ADLs as shown by improving score on box and blocks test by at least 6 blocks with dominant RUE.    Baseline  R-27 blocks, L-38 blocks    Time  8    Period  Weeks    Status  New      OT LONG TERM GOAL #4   Title  Pt will improve coordination for ADLs as shown by improving time on 9-hole peg test by at least 3sec bilaterally.    Baseline  R-35.56sec, L-34.97sec    Time  8    Period  Weeks    Status  New      OT LONG TERM GOAL #5   Title  Pt will be able to fasten/unfasten 3 buttons in less than 90sec.    Baseline  62min, 5sec    Time  8    Period  Weeks    Status  New              Patient will benefit from skilled therapeutic intervention in order to improve the following deficits and impairments:     Visit Diagnosis: Attention and concentration deficit  Frontal lobe and executive function deficit  Other symptoms and signs involving the nervous system  Other symptoms and signs involving the musculoskeletal system  Other lack of coordination    Problem List Patient Active Problem List   Diagnosis Date Noted  . Hyperlipidemia 08/12/2018  . Mild dementia ([place]) 05/24/2017  . Chest pain 04/03/2017  . Mass of parotid gland 11/29/2015    , 11/13/2018, 1:10 PM  [facility] [address] [place], [place], [address] Phone: [phone]   Fax:  [phone]  Name: Gavin Campbell MRN: [id number] Date of Birth: 05-Aug-1935

## 2018-11-17 ENCOUNTER — Encounter: Payer: Self-pay | Admitting: Physical Therapy

## 2018-11-17 ENCOUNTER — Ambulatory Visit: Payer: Medicare PPO | Admitting: Occupational Therapy

## 2018-11-17 ENCOUNTER — Encounter: Payer: Self-pay | Admitting: Occupational Therapy

## 2018-11-17 ENCOUNTER — Ambulatory Visit: Payer: Medicare PPO | Attending: Family Medicine | Admitting: Physical Therapy

## 2018-11-17 DIAGNOSIS — R2689 Other abnormalities of gait and mobility: Secondary | ICD-10-CM | POA: Diagnosis not present

## 2018-11-17 DIAGNOSIS — R29818 Other symptoms and signs involving the nervous system: Secondary | ICD-10-CM | POA: Diagnosis not present

## 2018-11-17 DIAGNOSIS — R41844 Frontal lobe and executive function deficit: Secondary | ICD-10-CM

## 2018-11-17 DIAGNOSIS — R29898 Other symptoms and signs involving the musculoskeletal system: Secondary | ICD-10-CM | POA: Diagnosis not present

## 2018-11-17 DIAGNOSIS — R293 Abnormal posture: Secondary | ICD-10-CM | POA: Diagnosis not present

## 2018-11-17 DIAGNOSIS — R41842 Visuospatial deficit: Secondary | ICD-10-CM | POA: Insufficient documentation

## 2018-11-17 DIAGNOSIS — R2681 Unsteadiness on feet: Secondary | ICD-10-CM | POA: Insufficient documentation

## 2018-11-17 DIAGNOSIS — R41840 Attention and concentration deficit: Secondary | ICD-10-CM | POA: Insufficient documentation

## 2018-11-17 DIAGNOSIS — R278 Other lack of coordination: Secondary | ICD-10-CM | POA: Diagnosis not present

## 2018-11-17 NOTE — Patient Instructions (Addendum)
Axial Extension (Chin Tuck)    Do this sitting at the wall, with a pillow or towel behind your head.  Pull chin in and lengthen back of neck, pressing the back of your head into the pillow or towel. Hold __3__ seconds while counting out loud. Repeat _10___ times. Do __2__ sessions per day.  http://[email or web address].exer.us/449   Copyright  VHI. All rights reserved.

## 2018-11-17 NOTE — Therapy (Signed)
[facility] [address] [place], [place], [address] Phone: [phone]   Fax:  [phone]  Physical Therapy Treatment  Patient Details  Name: Gavin Campbell MRN: [id number] Date of Birth: 1934-11-13 Referring Provider (PT): Dr. Ellouise Newer   Encounter Date: 11/17/2018  PT End of Session - 11/17/18 1931    Visit Number  5    Number of Visits  16    Date for PT Re-Evaluation  12/19/18    Authorization Type  [employer]    PT Start Time  1232    PT Stop Time  1314    PT Time Calculation (min)  42 min    Activity Tolerance  Patient tolerated treatment well    Behavior During Therapy  [facility] for tasks assessed/performed       Past Medical History:  Diagnosis Date  . Blurred vision   . Chest pain 2007   ABNORMAL CARDIOLITE   . Coronary arteriosclerosis in native artery   . Diplopia   . Early satiety   . Hyperlipidemia   . Hypertension   . Left ventricular systolic dysfunction   . Orthostatic dizziness   . Parotid mass   . Pure hypercholesterolemia   . Unsteady gait   . Venous stasis dermatitis of right lower extremity     Past Surgical History:  Procedure Laterality Date  . CORONARY ANGIOPLASTY  2009   NON OBSTRUCTIVE DISEASE    There were no vitals filed for this visit.  Subjective Assessment - 11/17/18 1235    Subjective  Feel very sleepy today-feel like it may be one of the medicines that does it-not sure which one.  Still doing the exercises 2x/wk.    Pertinent History  HTN, hyperlipidemia, bradycardia, mild dementia, diplopia    Patient Stated Goals  "Walk without bending over".                         [facility] Adult PT Treatment/Exercise - 11/17/18 0001      Transfers   Transfers  Sit to Stand;Stand to Sit    Sit to Stand  6: Modified independent (Device/Increase time);With upper extremity assist;Without upper extremity assist;From bed;From chair/3-in-1    Five time sit to  stand comments   26.9   use of UE support at chair   Stand to Sit  6: Modified independent (Device/Increase time);With upper extremity assist;Without upper extremity assist;To bed;To chair/3-in-1    Number of Reps  Other reps (comment);Other sets (comment)    Comments  5 reps from mat surface with minimal UE support, 5 reps from chair with UE support, then 5 reps from chair no UE support      Ambulation/Gait   Ambulation/Gait  Yes    Ambulation/Gait Assistance  5: Supervision    Ambulation/Gait Assistance Details  VCs for upright posture, forward gaze    Ambulation Distance (Feet)  230 Feet    Assistive device  None    Gait Pattern  Step-through pattern;Decreased arm swing - right;Decreased arm swing - left;Decreased step length - right;Decreased step length - left;Decreased stride length;Trunk flexed;Decreased trunk rotation;Poor foot clearance - left;Poor foot clearance - right    Ambulation Surface  Level;Indoor    Gait velocity  12.59 sec = 2.61 ft/sec; with cues, 10.84 sec = 3.05 ft/sec      Neck Exercises: Standing   Neck Retraction  5 reps;3 secs   towel behind head standing at wall  Neck Exercises: Seated   Neck Retraction  10 reps;3 secs    Neck Retraction Limitations  towel behind head, cues for use of visual target to look ahead to maintain posture    Other Seated Exercise  Seated scapular retraction, x 10 reps, with visual/verbal cues to maintain technique      Knee/Hip Exercises: Aerobic   Stepper  Seated Stepper, Level 1.2 x 5 minutes lower extremities, worked up to keep RPM >50       Seated at edge of chair, then edge of mat, attempted anterior pelvic tilts, with facilitation at trunk, and tactile/verbal cues to "stick chest out", to bring shoulders in line with hips (as he tends to sit with shoulders posterior to hips).  Pt has difficulty with technique of anterior pelvic tilts and will need more work on this.      PT Education - 11/17/18 1931    Education  Details  Added seated posture exercise (chin tucks with towel behind head)    Person(s) Educated  Patient;Spouse    Methods  Explanation;Demonstration;Tactile cues;Verbal cues;Handout    Comprehension  Verbalized understanding;Returned demonstration;Verbal cues required;Need further instruction       PT Short Term Goals - 11/17/18 1251      PT SHORT TERM GOAL #1   Title  The patient will return demo HEP with assist from family (emphasizing posture, flexibility, large amplitude movements).    Time  4    Period  Weeks    Status  On-going      PT SHORT TERM GOAL #2   Title  The patient will improve gait speed from 2.16 ft/sec to > or equa lto 2.62 ft/sec to demo transition to "full community ambulator" classification of gait.    Baseline  2.61 ft/sec    Time  4    Period  Weeks    Status  Partially Met      PT SHORT TERM GOAL #3   Title  The patient will perform sit<>stand x 5 reps without UE support demonstrating improved initiation and power for sit<>stand.    Time  4    Period  Weeks    Status  Achieved      PT SHORT TERM GOAL #4   Title  The patient will subjective report decreased stiffness in the morning with neck stretching.    Time  4    Period  Weeks    Status  On-going      PT SHORT TERM GOAL #5   Title  The patient will be further assessed on Berg and goal to follow.    Baseline  45/56    Time  4    Period  Weeks    Status  Achieved        PT Long Term Goals - 11/10/18 1940      PT LONG TERM GOAL #1   Title  The patient will perform HEP progression with assist from family.    Time  8    Period  Weeks      PT LONG TERM GOAL #2   Title  The patient will improve 5 time sit<>stand to < or equal to 25 seconds to demo improving transitional movements.    Time  8    Period  Weeks      PT LONG TERM GOAL #3   Title  The patient will improve time to negotiate 4 steps from 18.28 seconds to < or equal to 14 seconds to demo improving  functional mobility.    Time   8    Period  Weeks      PT LONG TERM GOAL #4   Title  The patient will improve total neck ROM for flexion + extension from 11 degrees up to 20 degrees of total movement.    Time  8    Period  Weeks      PT LONG TERM GOAL #5   Title  The patient will participate in home walking program with family for continued activity.    Time  8    Period  Weeks      PT LONG TERM GOAL #6   Title  Patient will improve Berg to >=48/56 to show improved balance.     Time  8    Period  Weeks    Status  New            Plan - 11/17/18 1933    Clinical Impression Statement  Began assessing STGs this visit, with pt partially meeting gait velocity goal.  Pt has improved gait velocity score to 2.61 ft/sec (at self-selected speed) and he is able to increase to 3.05 ft/sec with cues.  Pt has met goal for sit<>stand with decreased UE support, and wife reports he is doing better with transfers at home.  Otherwise, focus of session on posture with sitting, standing, gait positions.  Pt will continue to benefit from skilled PT to address balance, posture, transfers and gait.    Rehab Potential  Good    PT Frequency  2x / week    PT Duration  8 weeks    PT Treatment/Interventions  ADLs/Self Care Home Management;Therapeutic activities;Therapeutic exercise;Neuromuscular re-education;Gait training;Stair training;Functional mobility training;Patient/family education;Manual techniques    PT Next Visit Plan  Check remaining STGs; try anterior pelvic tilts for posture.  ?try Standing PWR! (pt with great difficulty with seated) Gait working on step length and intensity.    PT Home Exercise Plan  [id number] in [place] and Agree with Plan of Care  Patient;Family member/caregiver   wife      Patient will benefit from skilled therapeutic intervention in order to improve the following deficits and impairments:  Abnormal gait, Decreased range of motion, Postural dysfunction, Decreased balance, Impaired  flexibility  Visit Diagnosis: Abnormal posture  Other symptoms and signs involving the nervous system  Other abnormalities of gait and mobility     Problem List Patient Active Problem List   Diagnosis Date Noted  . Hyperlipidemia 08/12/2018  . Mild dementia ([place]) 05/24/2017  . Chest pain 04/03/2017  . Mass of parotid gland 11/29/2015    Oumou Smead W. 11/17/2018, 7:37 PM  Frazier Butt., PT   [facility] [address] [place], [place], [address] Phone: [phone]   Fax:  [phone]  Name: Gavin Campbell MRN: [id number] Date of Birth: [birth year]

## 2018-11-17 NOTE — Therapy (Signed)
[facility] [address] [place], [place], [address] Phone: [phone]   Fax:  [phone]  Occupational Therapy Treatment  Patient Details  Name: Gavin Campbell MRN: [id number] Date of Birth: 05/06/1935 Referring Provider (OT): Dr. Ellouise Newer   Encounter Date: 11/17/2018  OT End of Session - 11/17/18 1320    Visit Number  9    Number of Visits  17    Date for OT Re-Evaluation  11/29/18    Authorization Type  [employer]-    Authorization Time Period  cert. 09/29/18-12/28/18    Authorization - Visit Number  9    Authorization - Number of Visits  10    OT Start Time  1320    OT Stop Time  1408    OT Time Calculation (min)  48 min    Activity Tolerance  Patient tolerated treatment well    Behavior During Therapy  WFL for tasks assessed/performed       Past Medical History:  Diagnosis Date  . Blurred vision   . Chest pain 2007   ABNORMAL CARDIOLITE   . Coronary arteriosclerosis in native artery   . Diplopia   . Early satiety   . Hyperlipidemia   . Hypertension   . Left ventricular systolic dysfunction   . Orthostatic dizziness   . Parotid mass   . Pure hypercholesterolemia   . Unsteady gait   . Venous stasis dermatitis of right lower extremity     Past Surgical History:  Procedure Laterality Date  . CORONARY ANGIOPLASTY  2009   NON OBSTRUCTIVE DISEASE    There were no vitals filed for this visit.  Subjective Assessment - 11/17/18 1320    Subjective   nothing new    Patient is accompained by:  Family member    Pertinent History  Parkinsonism (diagnosed approx 6 months ago per pt); hypertension, hyperlipidemia, bradycardia, with mild dementia, diplopia    Limitations  diplopia, mild dementia    Patient Stated Goals  improve posture, stiffness, easier to dress    Currently in Pain?  No/denies       Modified quadruped, PWR! Rock and up with mod cueing for large amplitude.  Standing at  counter, PWR! Rock to targets with reach with min cueing for large amplitude and wt. Shift.    Pt educated in importance of wider base of support, wt. Shifts, and opening hand big to decr risk of future complications.  Pt verbalized understanding.  Standing with 1 UE support while performing wt. Shifts forward/back and tossing beanbags with each hand with min-mod cueing for PWR! Hands, large amplitude movements.    Standing and performing functional reaching overhead with each UE with trunk rotation/wt. Shift and min-mod cueing for feet apart and opening hand big.  Fastening/unfastening buttons on table with max difficulty, on pt with mod-max difficulty with cueing for strategies.  Education regarding use of button hook, pt performed with mod verbal and tactile cueing.    OT Education - 11/17/18 1434    Education Details  Recommended journaling times that pt moves well or struggles for several weeks to determine patterns to convey to neurologist to assist in medication management; reviewed/reinforced need for consistent daily and med schedule; use of large amplitude hand movements for buttoning, clothing adjustment, and grasp of cylinder objects    Person(s) Educated  Patient;Spouse    Methods  Explanation    Comprehension  Verbalized understanding;Need further instruction       OT  Short Term Goals - 11/17/18 1356      OT SHORT TERM GOAL #1   Title  Pt will be independent with PD-specific HEP to address posture, bradykinesia, rigidity, and coordination.--check STGs 10/29/18    Time  4    Period  Weeks    Status  On-going      OT SHORT TERM GOAL #2   Title  Pt will verbalize understanding of ways to prevent future complications related to PD and appropriate community resources.    Time  4    Period  Weeks    Status  On-going   11/17/18:  educated in community resources, but would benefit from review/additional education of decr risk of future complications     OT SHORT TERM GOAL #3    Title  Pt will improve coordination as shown by fastening/unfastening 3 buttons in less than 44min.    Time  4    Period  Weeks    Status  On-going   11/17/18:  unable today      OT SHORT TERM GOAL #4   Title  Pt will improve ability to dress as shown by completing PPT #4 in less than 20sec.    Time  4    Period  Weeks    Status  On-going   11/10/18:  32.97sec min v.c.     OT SHORT TERM GOAL #5   Title  Pt will improve standing functional reach by at least 2" bilaterally for improved balance for ADLs.      Baseline  R-6", L-7"    Time  4    Period  Weeks    Status  On-going      OT SHORT TERM GOAL #6   Title  Pt will report incr ease with donning/doffing socks and pull-over shirt.    Time  4    Period  Weeks    Status  Achieved   11/10/18       OT Long Term Goals - 09/29/18 1544      OT LONG TERM GOAL #1   Title  Pt will verbalize understanding of adaptive stratgies to incr. safety/ease/independence with ADLs/IADLs.--check LTGs 11/29/18    Time  8    Period  Weeks    Status  New      OT LONG TERM GOAL #2   Title  Pt will verbalize understanding of memory compensation strategies and ways to keep thinking skills sharp.    Time  8    Period  Weeks    Status  New      OT LONG TERM GOAL #3   Title  Pt will improve functional reaching/coordination for ADLs as shown by improving score on box and blocks test by at least 6 blocks with dominant RUE.    Baseline  R-27 blocks, L-38 blocks    Time  8    Period  Weeks    Status  New      OT LONG TERM GOAL #4   Title  Pt will improve coordination for ADLs as shown by improving time on 9-hole peg test by at least 3sec bilaterally.    Baseline  R-35.56sec, L-34.97sec    Time  8    Period  Weeks    Status  New      OT LONG TERM GOAL #5   Title  Pt will be able to fasten/unfasten 3 buttons in less than 90sec.    Baseline  82min, 5sec    Time  8    Period  Weeks    Status  New            Plan - 11/17/18 1320    Clinical  Impression Statement  Pt is progressing slowly due to cognitive deficits.  Pt is inconsistent at times and wife reports that he is no longer consistent with daily and medication schedules.  Pt would benefit from continued reinforcement of education/strategies for ADLs.    Occupational Profile and client history currently impacting functional performance  Pt was independent, working as a Art gallery manager, and driving until approx 4 months ago.  Pt is now mod I with most ADLs, but receiving some assist due to effort required.  Pt is no longer working or driving.  Wife has also started doing financial management tasks.  Pt is less active socially since he stopped working and no longer walks in neighborhood like he did.    Occupational performance deficits (Please refer to evaluation for details):  ADL's;IADL's;Social Participation;Leisure;Work    Neurosurgeon    Current Impairments/barriers affecting progress:  mild dementia    OT Frequency  2x / week    OT Duration  8 weeks    OT Treatment/Interventions  Self-care/ADL training;Therapeutic exercise;Visual/perceptual remediation/compensation;Patient/family education;Neuromuscular education;Paraffin;Moist Heat;Aquatic Therapy;Fluidtherapy;Energy conservation;Therapist, nutritional;Therapeutic activities;Balance training;Cryotherapy;Ultrasound;DME and/or AE instruction;Manual Therapy;Passive range of motion;Cognitive remediation/compensation    Plan  progress note; continue with strategies for ADLs (issue handout), continue with ways to decr risk of future complications (issue handout)    Recommended Other Services  physical therapy--requested order    Consulted and Agree with Plan of Care  Patient;Family member/caregiver    Family Member Consulted  spouse       Patient will benefit from skilled therapeutic intervention in order to improve the following deficits and impairments:  Decreased cognition, Decreased knowledge of use of DME, Impaired  flexibility, Pain, Decreased mobility, Decreased coordination, Improper spinal/pelvic alignment, Impaired tone, Decreased strength, Decreased range of motion, Decreased activity tolerance, Decreased endurance, Decreased balance, Decreased knowledge of precautions, Impaired UE functional use  Visit Diagnosis: Other symptoms and signs involving the nervous system  Attention and concentration deficit  Frontal lobe and executive function deficit  Other symptoms and signs involving the musculoskeletal system  Other lack of coordination  Abnormal posture  Other abnormalities of gait and mobility  Unsteadiness on feet  Visuospatial deficit    Problem List Patient Active Problem List   Diagnosis Date Noted  . Hyperlipidemia 08/12/2018  . Mild dementia ([place]) 05/24/2017  . Chest pain 04/03/2017  . Mass of parotid gland 11/29/2015    [facility] 11/17/2018, 2:49 PM  [facility] [address] [place], [place], [address] Phone: [phone]   Fax:  [phone]  Name: ARTIN MCEUEN MRN: [id number] Date of Birth: 1935-05-26   Vianne Bulls, OTR/L [facility] [address] [place], [place]  [address] [phone] phone [phone] 11/17/18 2:49 PM

## 2018-11-18 ENCOUNTER — Ambulatory Visit: Payer: Medicare PPO | Admitting: Sports Medicine

## 2018-11-18 ENCOUNTER — Encounter: Payer: Self-pay | Admitting: Sports Medicine

## 2018-11-18 DIAGNOSIS — M79675 Pain in left toe(s): Secondary | ICD-10-CM

## 2018-11-18 DIAGNOSIS — B351 Tinea unguium: Secondary | ICD-10-CM | POA: Diagnosis not present

## 2018-11-18 DIAGNOSIS — F03A0 Unspecified dementia, mild, without behavioral disturbance, psychotic disturbance, mood disturbance, and anxiety: Secondary | ICD-10-CM

## 2018-11-18 DIAGNOSIS — M79674 Pain in right toe(s): Secondary | ICD-10-CM | POA: Diagnosis not present

## 2018-11-18 DIAGNOSIS — F0390 Unspecified dementia without behavioral disturbance: Secondary | ICD-10-CM | POA: Diagnosis not present

## 2018-11-18 NOTE — Progress Notes (Signed)
Patient ID: Gavin Campbell, male   DOB: 1935-08-25, 84 y.o.   MRN: [id number] Subjective: Gavin Campbell is a 84 y.o. male patient seen today in office with complaint of painful thickened and elongated toenails; unable to trim. Patient denies any changes to medical history.  No other issues noted.   Patient Active Problem List   Diagnosis Date Noted  . Hyperlipidemia 08/12/2018  . Mild dementia ([place]) 05/24/2017  . Chest pain 04/03/2017  . Seasonal allergic rhinitis 02/27/2016  . Hoarseness 02/27/2016  . Mass of parotid gland 11/29/2015    Current Outpatient Medications on File Prior to Visit  Medication Sig Dispense Refill  . amLODipine (NORVASC) 2.5 MG tablet Take 2.5 mg by mouth daily.    Marland Kitchen aspirin 81 MG tablet Take 81 mg by mouth daily.    . carbidopa-levodopa (SINEMET IR) 25-100 MG tablet Take 1/2 tablet three times a day with meals 30 tablet 11  . Coenzyme Q10 10 MG capsule Take 10 mg by mouth daily.    Marland Kitchen desoximetasone (TOPICORT) 0.25 % cream MASSAGE ON RASH D  5  . memantine (NAMENDA) 5 MG tablet Take 1 tablet twice a day 180 tablet 3  . metoprolol succinate (TOPROL-XL) 25 MG 24 hr tablet Take 25 mg by mouth daily.    . mirtazapine (REMERON) 15 MG tablet TK 1 T PO QPM HS    . ranitidine (ZANTAC) 150 MG tablet Take 150 mg by mouth at bedtime.    . simvastatin (ZOCOR) 20 MG tablet Take 20 mg by mouth daily.    . TRIAMCINOLONE ACETONIDE EX Apply topically 2 (two) times daily.     No current facility-administered medications on file prior to visit.     No Known Allergies  Objective: Physical Exam  General: Well developed, nourished, no acute distress, awake, alert and oriented x 3  Vascular: Dorsalis pedis artery 2/4 bilateral, Posterior tibial artery 1/4 bilateral, skin temperature warm to warm proximal to distal bilateral lower extremities, no varicosities, pedal hair present bilateral.  Neurological: Gross sensation present via light touch bilateral.   Dermatological:  Skin is warm, dry, and supple bilateral, Nails 1-10 are tender, long, thick, and discolored with mild subungal debris, no webspace macerations present bilateral, no open lesions present bilateral, no callus/corns/hyperkeratotic tissue present bilateral. No signs of infection bilateral.  Musculoskeletal: Bunion and hammertoe deformities noted bilateral. Muscular strength within normal limits without painon range of motion. No pain with calf compression bilateral.  Assessment and Plan:  Problem List Items Addressed This Visit      Nervous and Auditory   Mild dementia ([place])   Relevant Medications   mirtazapine (REMERON) 15 MG tablet    Other Visit Diagnoses    Dermatophytosis of nail    -  Primary   Toe pain, bilateral          -Examined patient.  -Mechanically debrided and reduced mycotic nails with sterile nail nipper and dremel nail file without incident. -Patient to return in 3 months for follow up evaluation or sooner if symptoms worsen.  Landis Martins, DPM

## 2018-11-19 ENCOUNTER — Ambulatory Visit: Payer: Medicare PPO | Admitting: Occupational Therapy

## 2018-11-19 ENCOUNTER — Ambulatory Visit: Payer: Medicare PPO | Admitting: Physical Therapy

## 2018-11-19 ENCOUNTER — Encounter: Payer: Self-pay | Admitting: Physical Therapy

## 2018-11-19 DIAGNOSIS — R41840 Attention and concentration deficit: Secondary | ICD-10-CM | POA: Diagnosis not present

## 2018-11-19 DIAGNOSIS — R293 Abnormal posture: Secondary | ICD-10-CM

## 2018-11-19 DIAGNOSIS — R278 Other lack of coordination: Secondary | ICD-10-CM | POA: Diagnosis not present

## 2018-11-19 DIAGNOSIS — R41842 Visuospatial deficit: Secondary | ICD-10-CM | POA: Diagnosis not present

## 2018-11-19 DIAGNOSIS — R29818 Other symptoms and signs involving the nervous system: Secondary | ICD-10-CM | POA: Diagnosis not present

## 2018-11-19 DIAGNOSIS — R29898 Other symptoms and signs involving the musculoskeletal system: Secondary | ICD-10-CM

## 2018-11-19 DIAGNOSIS — R41844 Frontal lobe and executive function deficit: Secondary | ICD-10-CM

## 2018-11-19 DIAGNOSIS — R2689 Other abnormalities of gait and mobility: Secondary | ICD-10-CM

## 2018-11-19 DIAGNOSIS — R2681 Unsteadiness on feet: Secondary | ICD-10-CM | POA: Diagnosis not present

## 2018-11-19 NOTE — Patient Instructions (Addendum)
Ways to prevent future Parkinson's related complications: 1.   Exercise regularly,  2.   Focus on BIGGER movements during daily activities- really reach overhead, straighten elbows and extend fingers 3.   When dressing or reaching for your seatbelt make sure to use your body to assist by twisting while you reach-this can help to minimize stress on the shoulder and reduce the risk of a rotator cuff tear 4.   Swing your arms when you walk! People with PD are at increased risk for frozen shoulder and swinging your arms can reduce this risk.                       Performing Daily Activities with Big Movements  Pick at least one activity a day and perform with BIG, DELIBERATE movements/effort. This can make the activity easier and turn daily activities into exercise!  If you are standing during the activity, make sure to keep feet apart and stand with good/big/PWR! UP posture.  Examples:  Dressing - Push arms in sleeves, twist when putting on jacket, push foot into pants, open hands to pull down shirt/put on socks/pull up pants  Bathing - Wash/dry with long strokes  Brushing your teeth - Big, slow movements  Cutting food - Long deliberate cuts  Opening jar/bottle - Move as much as you can with each turn  Picking up a cup/bottle - Open hand up big and get object all the way in palm  Hanging up clothes/getting clothes down from closet - Reach with big effort  Putting away groceries/dishes - Reach with big effort  Wiping counter/table - Move in big, long strokes  Stirring while cooking - Exaggerate moves  Sweeping - Move arms in big, long strokes  Vacuuming - Push with big movement  Folding clothes - Exaggerate arm movements  Raking - Move arms in big, long strokes  Using a screwdriver - Move as much as you can with each turn  Walking into a store/restaurant - Walk with big steps, swing arms if able  Standing up from a chair/recliner/sofa - Scoot forward,  lean forward, and stand with big effort

## 2018-11-19 NOTE — Therapy (Signed)
[facility] [address] [place], [place], [address] Phone: [phone]   Fax:  [phone]  Occupational Therapy Treatment  Patient Details  Name: Gavin Campbell MRN: [id number] Date of Birth: [birth year] Referring Provider (OT): Dr. Ellouise Newer   Encounter Date: 11/19/2018  OT End of Session - 11/19/18 1156    Visit Number  10    Number of Visits  17    Date for OT Re-Evaluation  11/29/18    Authorization Type  [employer]-    Authorization Time Period  cert. 09/29/18-12/28/18    Authorization - Visit Number  10    Authorization - Number of Visits  10    OT Start Time  6962    OT Stop Time  1230    OT Time Calculation (min)  43 min    Activity Tolerance  Patient tolerated treatment well    Behavior During Therapy  WFL for tasks assessed/performed       Past Medical History:  Diagnosis Date  . Blurred vision   . Chest pain 2007   ABNORMAL CARDIOLITE   . Coronary arteriosclerosis in native artery   . Diplopia   . Early satiety   . Hyperlipidemia   . Hypertension   . Left ventricular systolic dysfunction   . Orthostatic dizziness   . Parotid mass   . Pure hypercholesterolemia   . Unsteady gait   . Venous stasis dermatitis of right lower extremity     Past Surgical History:  Procedure Laterality Date  . CORONARY ANGIOPLASTY  2009   NON OBSTRUCTIVE DISEASE    There were no vitals filed for this visit.  Subjective Assessment - 11/19/18 1316    Subjective   Denies pain    Pertinent History  Parkinsonism (diagnosed approx 6 months ago per pt); hypertension, hyperlipidemia, bradycardia, with mild dementia, diplopia    Patient Stated Goals  improve posture, stiffness, easier to dress    Currently in Pain?  No/denies            Treatment; Modified quadruped, PWR! Rock and up with min-mod cueing for large amplitude. Checked progress towards short term goals.             OT  Treatment/ Education - 11/19/18 1308    Education Details Education regarding  ways to prevent future PD complications, big movements with ADLS, reviewed previously issued coordination HEP- pt returned demonstration with mod v.c, pt's wife is able to cue him   Person(s) Educated  Patient;Spouse    Methods  Explanation;Demonstration;Verbal cues    Comprehension  Verbalized understanding;Returned demonstration;Verbal cues required       OT Short Term Goals - 11/19/18 1157      OT SHORT TERM GOAL #1   Title  Pt will be independent with PD-specific HEP to address posture, bradykinesia, rigidity, and coordination.--check STGs 10/29/18    Time  4    Period  Weeks    Status  Achieved   with wife assisting     OT SHORT TERM GOAL #2   Title  Pt will verbalize understanding of ways to prevent future complications related to PD and appropriate community resources.    Time  4    Period  Weeks    Status  Achieved   11/17/18:  educated in community resources, but would benefit from review/additional education of decr risk of future complications     OT SHORT TERM GOAL #3   Title  Pt will improve  coordination as shown by fastening/unfastening 3 buttons in less than 50mn.    Time  4    Period  Weeks    Status  On-going   11/17/18:  unable today      OT SHORT TERM GOAL #4   Title  Pt will improve ability to dress as shown by completing PPT #4 in less than 20sec.    Time  4    Period  Weeks    Status  Not Met   11/10/18:  30.75 secs     OT SHORT TERM GOAL #5   Title  Pt will improve standing functional reach by at least 2" bilaterally for improved balance for ADLs.      Baseline  R-6", L-7"    Time  4    Period  Weeks    Status  Partially Met   met for right, ongoing left  8.5 bilaterally     OT SHORT TERM GOAL #6   Title  Pt will report incr ease with donning/doffing socks and pull-over shirt.    Time  4    Period  Weeks    Status  Achieved   11/10/18       OT Long Term Goals -  09/29/18 1544      OT LONG TERM GOAL #1   Title  Pt will verbalize understanding of adaptive stratgies to incr. safety/ease/independence with ADLs/IADLs.--check LTGs 11/29/18    Time  8    Period  Weeks    Status  New      OT LONG TERM GOAL #2   Title  Pt will verbalize understanding of memory compensation strategies and ways to keep thinking skills sharp.    Time  8    Period  Weeks    Status  New      OT LONG TERM GOAL #3   Title  Pt will improve functional reaching/coordination for ADLs as shown by improving score on box and blocks test by at least 6 blocks with dominant RUE.    Baseline  R-27 blocks, L-38 blocks    Time  8    Period  Weeks    Status  New      OT LONG TERM GOAL #4   Title  Pt will improve coordination for ADLs as shown by improving time on 9-hole peg test by at least 3sec bilaterally.    Baseline  R-35.56sec, L-34.97sec    Time  8    Period  Weeks    Status  New      OT LONG TERM GOAL #5   Title  Pt will be able to fasten/unfasten 3 buttons in less than 90sec.    Baseline  38m, 5sec    Time  8    Period  Weeks    Status  New            Plan - 11/19/18 1309    Clinical Impression Statement  For the reporting period of 09/29/18-11/19/18, pt is progressing towards goals however he has not fully achieved all goals due to cognitive deficits, abnormal posture, bradykinesia and decreased coordination which impedes perfromance of ADLS/IADLS. Pt can benefit from continued skilled occupational therapy to address these deficits in order to maximiaze pt's safety and I with daily activities.    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  Pt is now mod I with most ADLs,  but receiving some assist due to effort required.  Pt is no longer working or driving.  Wife has also started doing financial management tasks.  Pt is less active socially since he  stopped working and no longer walks in neighborhood like he did.    Rehab Potential  Good    OT Frequency  2x / week    OT Duration  8 weeks    OT Treatment/Interventions  Self-care/ADL training;Therapeutic exercise;Visual/perceptual remediation/compensation;Patient/family education;Neuromuscular education;Paraffin;Moist Heat;Aquatic Therapy;Fluidtherapy;Energy conservation;Therapist, nutritional;Therapeutic activities;Balance training;Cryotherapy;Ultrasound;DME and/or AE instruction;Manual Therapy;Passive range of motion;Cognitive remediation/compensation    Plan  continue to work towards unmet goals    Clinical Decision Making  Several treatment options, min-mod task modification necessary    Family Member Consulted  spouse       Patient will benefit from skilled therapeutic intervention in order to improve the following deficits and impairments:  Decreased cognition, Decreased knowledge of use of DME, Impaired flexibility, Pain, Decreased mobility, Decreased coordination, Improper spinal/pelvic alignment, Impaired tone, Decreased strength, Decreased range of motion, Decreased activity tolerance, Decreased endurance, Decreased balance, Decreased knowledge of precautions, Impaired UE functional use  Visit Diagnosis: Attention and concentration deficit  Frontal lobe and executive function deficit  Other symptoms and signs involving the nervous system  Other symptoms and signs involving the musculoskeletal system  Other lack of coordination  Abnormal posture  Other abnormalities of gait and mobility    Problem List Patient Active Problem List   Diagnosis Date Noted  . Hyperlipidemia 08/12/2018  . Mild dementia ([place]) 05/24/2017  . Chest pain 04/03/2017  . Seasonal allergic rhinitis 02/27/2016  . Hoarseness 02/27/2016  . Mass of parotid gland 11/29/2015    RINE,KATHRYN 11/19/2018, 1:16 PM  [facility] [address]  [place], [place], [address] Phone: [phone]   Fax:  [phone]  Name: BEXTON HAAK MRN: [id number] Date of Birth: 07-09-1935

## 2018-11-20 NOTE — Therapy (Signed)
[facility] [address] [place], [place], [address] Phone: [phone]   Fax:  [phone]  Physical Therapy Treatment  Patient Details  Name: Gavin Campbell MRN: [id number] Date of Birth: 1935/03/30 Referring Provider (PT): Dr. Ellouise Newer   Encounter Date: 11/19/2018  PT End of Session - 11/20/18 1504    Visit Number  6    Number of Visits  16    Date for PT Re-Evaluation  12/19/18    Authorization Type  [employer]    PT Start Time  1102    PT Stop Time  1144    PT Time Calculation (min)  42 min    Activity Tolerance  Patient tolerated treatment well    Behavior During Therapy  [facility] for tasks assessed/performed       Past Medical History:  Diagnosis Date  . Blurred vision   . Chest pain 2007   ABNORMAL CARDIOLITE   . Coronary arteriosclerosis in native artery   . Diplopia   . Early satiety   . Hyperlipidemia   . Hypertension   . Left ventricular systolic dysfunction   . Orthostatic dizziness   . Parotid mass   . Pure hypercholesterolemia   . Unsteady gait   . Venous stasis dermatitis of right lower extremity     Past Surgical History:  Procedure Laterality Date  . CORONARY ANGIOPLASTY  2009   NON OBSTRUCTIVE DISEASE    There were no vitals filed for this visit.  Subjective Assessment - 11/19/18 1105    Subjective  No changes this visit.  Not as sleepy this morning.    Pertinent History  HTN, hyperlipidemia, bradycardia, mild dementia, diplopia    Patient Stated Goals  "Walk without bending over".      Currently in Pain?  No/denies                       [facility] Adult PT Treatment/Exercise - 11/20/18 0001      Ambulation/Gait   Ambulation/Gait  Yes    Ambulation/Gait Assistance  5: Supervision    Ambulation/Gait Assistance Details  Cues for upright posture, visual targets, cues for arm swing    Ambulation Distance (Feet)  200 Feet   x 2; 115 ft x 2; 80 ft   Assistive  device  None    Gait Pattern  Step-through pattern;Decreased arm swing - right;Decreased arm swing - left;Decreased step length - right;Decreased step length - left;Decreased stride length;Trunk flexed;Decreased trunk rotation;Poor foot clearance - left;Poor foot clearance - right    Ambulation Surface  Level;Indoor      Neck Exercises: Standing   Neck Retraction  5 reps;3 secs   towel behind head standing at wall   Other Standing Exercises  scapular retraction 2 sets x 5 reps at column      Neck Exercises: Seated   Neck Retraction  10 reps;3 secs    Neck Retraction Limitations  towel behind head, cues for use of visual target to look ahead to maintain posture    Other Seated Exercise  Seated anterior/posterior pelvic tilts, x 10 reps, with facilitation cues.  Then (modified seated PWR! Moves):  forward lean to upright sitting x 10, side to side rocking through hips x 10 reps, seated axial mobility trunk rotation x 5 reps each side, then seated stepping out and in, 1 leg at a time, cues for high step, deliberate step motion.      Seated neck flexibility  exercises:  Neck flexion x 5, neck extension x 5, neck rotation R and L x 5, with gentle end range overpressures.  Cues to look and verbalize what he sees with neck rotation.         PT Short Term Goals - 11/20/18 1459      PT SHORT TERM GOAL #1   Title  The patient will return demo HEP with assist from family (emphasizing posture, flexibility, large amplitude movements).    Baseline  reports performing 2x/wk (did not bring HEP folder in this week)    Time  4    Period  Weeks    Status  Partially Met      PT SHORT TERM GOAL #2   Title  The patient will improve gait speed from 2.16 ft/sec to > or equa lto 2.62 ft/sec to demo transition to "full community ambulator" classification of gait.    Baseline  2.61 ft/sec    Time  4    Period  Weeks    Status  Partially Met      PT SHORT TERM GOAL #3   Title  The patient will perform  sit<>stand x 5 reps without UE support demonstrating improved initiation and power for sit<>stand.    Time  4    Period  Weeks    Status  Achieved      PT SHORT TERM GOAL #4   Title  The patient will subjective report decreased stiffness in the morning with neck stretching.    Baseline  Reports improved flexibility after exercises    Time  4    Period  Weeks    Status  Achieved      PT SHORT TERM GOAL #5   Title  The patient will be further assessed on Berg and goal to follow.    Baseline  45/56    Time  4    Period  Weeks    Status  Achieved        PT Long Term Goals - 11/10/18 1940      PT LONG TERM GOAL #1   Title  The patient will perform HEP progression with assist from family.    Time  8    Period  Weeks      PT LONG TERM GOAL #2   Title  The patient will improve 5 time sit<>stand to < or equal to 25 seconds to demo improving transitional movements.    Time  8    Period  Weeks      PT LONG TERM GOAL #3   Title  The patient will improve time to negotiate 4 steps from 18.28 seconds to < or equal to 14 seconds to demo improving functional mobility.    Time  8    Period  Weeks      PT LONG TERM GOAL #4   Title  The patient will improve total neck ROM for flexion + extension from 11 degrees up to 20 degrees of total movement.    Time  8    Period  Weeks      PT LONG TERM GOAL #5   Title  The patient will participate in home walking program with family for continued activity.    Time  8    Period  Weeks      PT LONG TERM GOAL #6   Title  Patient will improve Berg to >=48/56 to show improved balance.     Time  8  Period  Weeks    Status  New            Plan - 11/20/18 1501    Clinical Impression Statement  Remaining STGs:  STG 1 partially met, with pt reporting doing HEP at home 2x/wk; he has not brought in HEP folder this week to check those specific exercises.  STG for neck flexibility met, as pt reports improved flexibility with exercises.  Pt is  responding well to neck and back flexibility exercises in sititng and standing; pt still needs cues for use of visual targets and upright posture with gait.    Rehab Potential  Good    PT Frequency  2x / week    PT Duration  8 weeks    PT Treatment/Interventions  ADLs/Self Care Home Management;Therapeutic activities;Therapeutic exercise;Neuromuscular re-education;Gait training;Stair training;Functional mobility training;Patient/family education;Manual techniques    PT Next Visit Plan  Try anterior pelvic tilts for posture and (modified seated PWR! Moves) ?Ttry Standing PWR!  Gait working on step length and intensity.  Requested pt/family bring in HEP folder for full review for HEP    PT Home Exercise Plan  [id number] in [place] and Agree with Plan of Care  Patient;Family member/caregiver   wife      Patient will benefit from skilled therapeutic intervention in order to improve the following deficits and impairments:  Abnormal gait, Decreased range of motion, Postural dysfunction, Decreased balance, Impaired flexibility  Visit Diagnosis: Abnormal posture  Other abnormalities of gait and mobility     Problem List Patient Active Problem List   Diagnosis Date Noted  . Hyperlipidemia 08/12/2018  . Mild dementia ([place]) 05/24/2017  . Chest pain 04/03/2017  . Seasonal allergic rhinitis 02/27/2016  . Hoarseness 02/27/2016  . Mass of parotid gland 11/29/2015    Ernesteen Mihalic W. 11/20/2018, 3:04 PM Frazier Butt., PT  [facility] [address] [place], [place], [address] Phone: [phone]   Fax:  [phone]  Name: EUSTACE HUR MRN: [id number] Date of Birth: 1934/11/20

## 2018-11-24 ENCOUNTER — Ambulatory Visit: Payer: Medicare PPO | Admitting: Physical Therapy

## 2018-11-24 ENCOUNTER — Encounter: Payer: Self-pay | Admitting: Physical Therapy

## 2018-11-24 ENCOUNTER — Ambulatory Visit: Payer: Medicare PPO | Admitting: Occupational Therapy

## 2018-11-24 ENCOUNTER — Encounter: Payer: Self-pay | Admitting: Occupational Therapy

## 2018-11-24 DIAGNOSIS — R29818 Other symptoms and signs involving the nervous system: Secondary | ICD-10-CM | POA: Diagnosis not present

## 2018-11-24 DIAGNOSIS — R293 Abnormal posture: Secondary | ICD-10-CM | POA: Diagnosis not present

## 2018-11-24 DIAGNOSIS — R41842 Visuospatial deficit: Secondary | ICD-10-CM | POA: Diagnosis not present

## 2018-11-24 DIAGNOSIS — R41844 Frontal lobe and executive function deficit: Secondary | ICD-10-CM

## 2018-11-24 DIAGNOSIS — R2681 Unsteadiness on feet: Secondary | ICD-10-CM

## 2018-11-24 DIAGNOSIS — R278 Other lack of coordination: Secondary | ICD-10-CM | POA: Diagnosis not present

## 2018-11-24 DIAGNOSIS — R41840 Attention and concentration deficit: Secondary | ICD-10-CM

## 2018-11-24 DIAGNOSIS — R29898 Other symptoms and signs involving the musculoskeletal system: Secondary | ICD-10-CM | POA: Diagnosis not present

## 2018-11-24 DIAGNOSIS — R2689 Other abnormalities of gait and mobility: Secondary | ICD-10-CM

## 2018-11-24 NOTE — Therapy (Signed)
[facility] [address] [place], [place], [address] Phone: [phone]   Fax:  [phone]  Physical Therapy Treatment  Patient Details  Name: Gavin Campbell MRN: [id number] Date of Birth: October 20, 1934 Referring Provider (PT): Dr. Ellouise Newer   Encounter Date: 11/24/2018  PT End of Session - 11/24/18 2046    Visit Number  7    Number of Visits  16    Date for PT Re-Evaluation  12/19/18    Authorization Type  [employer]    PT Start Time  1230    PT Stop Time  1314    PT Time Calculation (min)  44 min    Activity Tolerance  Patient tolerated treatment well    Behavior During Therapy  [facility] for tasks assessed/performed       Past Medical History:  Diagnosis Date  . Blurred vision   . Chest pain 2007   ABNORMAL CARDIOLITE   . Coronary arteriosclerosis in native artery   . Diplopia   . Early satiety   . Hyperlipidemia   . Hypertension   . Left ventricular systolic dysfunction   . Orthostatic dizziness   . Parotid mass   . Pure hypercholesterolemia   . Unsteady gait   . Venous stasis dermatitis of right lower extremity     Past Surgical History:  Procedure Laterality Date  . CORONARY ANGIOPLASTY  2009   NON OBSTRUCTIVE DISEASE    There were no vitals filed for this visit.  Subjective Assessment - 11/24/18 1232    Subjective  No changes, no falls since last visit.  Had a restful, quiet weekend.    Pertinent History  HTN, hyperlipidemia, bradycardia, mild dementia, diplopia    Patient Stated Goals  "Walk without bending over".      Currently in Pain?  No/denies                       [facility] Adult PT Treatment/Exercise - 11/24/18 0001      Ambulation/Gait   Ambulation/Gait  Yes    Ambulation/Gait Assistance  5: Supervision    Ambulation/Gait Assistance Details  Cues for upright posture, visual targets, arm swing    Ambulation Distance (Feet)  115 Feet   then 230 ft x 2   Assistive  device  None   bilateral walking poles to facilitate arm swing   Gait Pattern  Step-through pattern;Decreased arm swing - right;Decreased arm swing - left;Decreased step length - right;Decreased step length - left;Decreased stride length;Trunk flexed;Decreased trunk rotation;Poor foot clearance - left;Poor foot clearance - right    Ambulation Surface  Level;Indoor    Gait Comments  Used bilateral walking poles with therapist assist, to facilitate arm swing, with cues for increased step length and to look ahead for use of visual target for improved upright posture      Posture/Postural Control   Posture/Postural Control  Postural limitations    Postural Limitations  Rounded Shoulders;Forward head;Increased thoracic kyphosis;Posterior pelvic tilt    Posture Comments  Discussed posture, and provided instruction to wife for brief, occasional reminder cues to help patient return to upright posture.      Neck Exercises: Standing   Neck Retraction  5 reps;3 secs   towel behind head; tried incorporating chin tuck with cues   Other Standing Exercises  Standing trunk rotation in corner, reaching for targets, cues to look at targets with reaching      Neck Exercises: Seated   Other  Seated Exercise  Seated anterior/posterior pelvic tilts, 2 sets x 10 reps, with facilitation cues.  Then (modified seated PWR! Moves):  forward lean to upright sitting x 10, side to side rocking through hips x 10 reps, seated axial mobility trunk rotation x 5 reps each side, then seated stepping out and in, 1 leg at a time, cues for high step, deliberate step motion.    Other Seated Exercise  Seated with green therapy ball in front of patient-forward lean pushing ball away to sitting up tall, x 10 reps; seated PWR! Up x 5 reps-with visual and tactile cues for technique, for scapular retraction.      Knee/Hip Exercises: Seated   Sit to Sand  2 sets;5 reps      With sit<>stand, cues given upon standing for upright posture, with  hold 3-5 seconds each rep         PT Short Term Goals - 11/20/18 1459      PT SHORT TERM GOAL #1   Title  The patient will return demo HEP with assist from family (emphasizing posture, flexibility, large amplitude movements).    Baseline  reports performing 2x/wk (did not bring HEP folder in this week)    Time  4    Period  Weeks    Status  Partially Met      PT SHORT TERM GOAL #2   Title  The patient will improve gait speed from 2.16 ft/sec to > or equa lto 2.62 ft/sec to demo transition to "full community ambulator" classification of gait.    Baseline  2.61 ft/sec    Time  4    Period  Weeks    Status  Partially Met      PT SHORT TERM GOAL #3   Title  The patient will perform sit<>stand x 5 reps without UE support demonstrating improved initiation and power for sit<>stand.    Time  4    Period  Weeks    Status  Achieved      PT SHORT TERM GOAL #4   Title  The patient will subjective report decreased stiffness in the morning with neck stretching.    Baseline  Reports improved flexibility after exercises    Time  4    Period  Weeks    Status  Achieved      PT SHORT TERM GOAL #5   Title  The patient will be further assessed on Berg and goal to follow.    Baseline  45/56    Time  4    Period  Weeks    Status  Achieved        PT Long Term Goals - 11/10/18 1940      PT LONG TERM GOAL #1   Title  The patient will perform HEP progression with assist from family.    Time  8    Period  Weeks      PT LONG TERM GOAL #2   Title  The patient will improve 5 time sit<>stand to < or equal to 25 seconds to demo improving transitional movements.    Time  8    Period  Weeks      PT LONG TERM GOAL #3   Title  The patient will improve time to negotiate 4 steps from 18.28 seconds to < or equal to 14 seconds to demo improving functional mobility.    Time  8    Period  Weeks      PT LONG TERM  GOAL #4   Title  The patient will improve total neck ROM for flexion + extension  from 11 degrees up to 20 degrees of total movement.    Time  8    Period  Weeks      PT LONG TERM GOAL #5   Title  The patient will participate in home walking program with family for continued activity.    Time  8    Period  Weeks      PT LONG TERM GOAL #6   Title  Patient will improve Berg to >=48/56 to show improved balance.     Time  8    Period  Weeks    Status  New            Plan - 11/24/18 2046    Clinical Impression Statement  Pt continues to report that posture is his main concern, so skilled PT session focused on exercises to address postural strengthening in sitting, standing, with sit<>stand transfers, and with gait.  Pt's wife did bring in HEP folder, and PT exercises appear to be appropriate.  Pt will continue to benefit from further skilled PT to address postural strength and neuromuscular reeducation, balance,a nd gait training for improved overall functional mobility.    Rehab Potential  Good    PT Frequency  2x / week    PT Duration  8 weeks    PT Treatment/Interventions  ADLs/Self Care Home Management;Therapeutic activities;Therapeutic exercise;Neuromuscular re-education;Gait training;Stair training;Functional mobility training;Patient/family education;Manual techniques    PT Next Visit Plan  ?Ttry Standing PWR!  Gait working on step length and intensity; continue postural strengthening; discuss walking program    PT Home Exercise Plan  [id number] in [place] and Agree with Plan of Care  Patient;Family member/caregiver   wife      Patient will benefit from skilled therapeutic intervention in order to improve the following deficits and impairments:  Abnormal gait, Decreased range of motion, Postural dysfunction, Decreased balance, Impaired flexibility  Visit Diagnosis: Abnormal posture  Other abnormalities of gait and mobility     Problem List Patient Active Problem List   Diagnosis Date Noted  . Hyperlipidemia 08/12/2018  . Mild  dementia ([place]) 05/24/2017  . Chest pain 04/03/2017  . Seasonal allergic rhinitis 02/27/2016  . Hoarseness 02/27/2016  . Mass of parotid gland 11/29/2015    Kamla Skilton W. 11/24/2018, 8:51 PM  Frazier Butt., PT   [facility] [address] [place], [place], [address] Phone: [phone]   Fax:  [phone]  Name: Gavin Campbell MRN: [id number] Date of Birth: 12/20/1934

## 2018-11-24 NOTE — Therapy (Signed)
[facility] [address] [place], [place], [address] Phone: [phone]   Fax:  [phone]  Occupational Therapy Treatment  Patient Details  Name: Gavin Campbell MRN: [id number] Date of Birth: April 04, 1935 Referring Provider (OT): Dr. Ellouise Newer   Encounter Date: 11/24/2018  OT End of Session - 11/24/18 1350    Visit Number  11    Number of Visits  17    Date for OT Re-Evaluation  11/29/18    Authorization Type  [employer]-    Authorization Time Period  cert. 09/29/18-12/28/18    Authorization - Visit Number  11    Authorization - Number of Visits  20    OT Start Time  6759    OT Stop Time  1400    OT Time Calculation (min)  39 min    Activity Tolerance  Patient tolerated treatment well    Behavior During Therapy  [facility] for tasks assessed/performed       Past Medical History:  Diagnosis Date  . Blurred vision   . Chest pain 2007   ABNORMAL CARDIOLITE   . Coronary arteriosclerosis in native artery   . Diplopia   . Early satiety   . Hyperlipidemia   . Hypertension   . Left ventricular systolic dysfunction   . Orthostatic dizziness   . Parotid mass   . Pure hypercholesterolemia   . Unsteady gait   . Venous stasis dermatitis of right lower extremity     Past Surgical History:  Procedure Laterality Date  . CORONARY ANGIOPLASTY  2009   NON OBSTRUCTIVE DISEASE    There were no vitals filed for this visit.  Subjective Assessment - 11/24/18 1349    Subjective   feels like exercises are going ok at home     Pertinent History  Parkinsonism (diagnosed approx 6 months ago per pt); hypertension, hyperlipidemia, bradycardia, with mild dementia, diplopia    Patient Stated Goals  improve posture, stiffness, easier to dress    Currently in Pain?  No/denies        PWR! Up, rock, twist in supine with mod cueing for performance and incr movement amplitude.  Simulated ADLs with bag with focus/min-mod cues  for large amplitude movements:  Donning/doffing pull-over shirt, donning/doffing pants.  Placing grooved pegs in pegboard with each hand with mod difficulty for in-hand manipulation, mod cueing for use of PWR! Hands with grasp/release.    In standing, functional reaching to place clothespins on vertical pole with each UE with set-up for trunk rotation, wt. Shift, and large amplitude reach, min-mod cueing for large amplitude hand movements, and to keep feet apart for improved balance.  Min cueing for large amplitude movements to pull out chair, step in front, and scoot to table.    Pt instructed in importance of opening hand big prior to grasp and with release of objects.  Practiced this with cylinder object with each hand with min-mod cueing.        OT Short Term Goals - 11/19/18 1157      OT SHORT TERM GOAL #1   Title  Pt will be independent with PD-specific HEP to address posture, bradykinesia, rigidity, and coordination.--check STGs 10/29/18    Time  4    Period  Weeks    Status  Achieved   with wife assisting     OT SHORT TERM GOAL #2   Title  Pt will verbalize understanding of ways to prevent future complications related to PD and appropriate  community resources.    Time  4    Period  Weeks    Status  Achieved   11/17/18:  educated in community resources, but would benefit from review/additional education of decr risk of future complications     OT SHORT TERM GOAL #3   Title  Pt will improve coordination as shown by fastening/unfastening 3 buttons in less than 86mn.    Time  4    Period  Weeks    Status  On-going   11/17/18:  unable today      OT SHORT TERM GOAL #4   Title  Pt will improve ability to dress as shown by completing PPT #4 in less than 20sec.    Time  4    Period  Weeks    Status  Not Met   11/10/18:  30.75 secs     OT SHORT TERM GOAL #5   Title  Pt will improve standing functional reach by at least 2" bilaterally for improved balance for ADLs.      Baseline   R-6", L-7"    Time  4    Period  Weeks    Status  Partially Met   met for right, ongoing left  8.5 bilaterally     OT SHORT TERM GOAL #6   Title  Pt will report incr ease with donning/doffing socks and pull-over shirt.    Time  4    Period  Weeks    Status  Achieved   11/10/18       OT Long Term Goals - 11/24/18 1353      OT LONG TERM GOAL #1   Title  Pt will verbalize understanding of adaptive stratgies to incr. safety/ease/independence with ADLs/IADLs.--check LTGs 11/29/18    Time  8    Period  Weeks    Status  New      OT LONG TERM GOAL #2   Title  Pt will verbalize understanding of memory compensation strategies and ways to keep thinking skills sharp.    Time  8    Period  Weeks    Status  On-going   education provided, may need review     OT LONG TERM GOAL #3   Title  Pt will improve functional reaching/coordination for ADLs as shown by improving score on box and blocks test by at least 6 blocks with dominant RUE.    Baseline  R-27 blocks, L-38 blocks    Time  8    Period  Weeks    Status  New      OT LONG TERM GOAL #4   Title  Pt will improve coordination for ADLs as shown by improving time on 9-hole peg test by at least 3sec bilaterally.    Baseline  R-35.56sec, L-34.97sec    Time  8    Period  Weeks    Status  New      OT LONG TERM GOAL #5   Title  Pt will be able to fasten/unfasten 3 buttons in less than 90sec.    Baseline  371m, 5sec    Time  8    Period  Weeks    Status  New            Plan - 11/24/18 1351    Clinical Impression Statement  Pt is progressing slowly towards goals.  Pt demo improved movement amplitude with cueing/repetition and with less cueing overall.    Occupational Profile and client history currently impacting functional performance  Pt was independent, working as a Art gallery manager, and driving until approx 4 months ago.  Pt is now mod I with most ADLs, but receiving some assist due to effort required.  Pt is no longer working or  driving.  Wife has also started doing financial management tasks.  Pt is less active socially since he stopped working and no longer walks in neighborhood like he did.    Rehab Potential  Good    Current Impairments/barriers affecting progress:  mild dementia    OT Frequency  2x / week    OT Duration  8 weeks    OT Treatment/Interventions  Self-care/ADL training;Therapeutic exercise;Visual/perceptual remediation/compensation;Patient/family education;Neuromuscular education;Paraffin;Moist Heat;Aquatic Therapy;Fluidtherapy;Energy conservation;Therapist, nutritional;Therapeutic activities;Balance training;Cryotherapy;Ultrasound;DME and/or AE instruction;Manual Therapy;Passive range of motion;Cognitive remediation/compensation    Plan  continue to work towards unmet goals, large amplitude movements, functional reaching, coordination    Clinical Decision Making  Several treatment options, min-mod task modification necessary    Family Member Consulted  spouse       Patient will benefit from skilled therapeutic intervention in order to improve the following deficits and impairments:  Decreased cognition, Decreased knowledge of use of DME, Impaired flexibility, Pain, Decreased mobility, Decreased coordination, Improper spinal/pelvic alignment, Impaired tone, Decreased strength, Decreased range of motion, Decreased activity tolerance, Decreased endurance, Decreased balance, Decreased knowledge of precautions, Impaired UE functional use  Visit Diagnosis: Attention and concentration deficit  Frontal lobe and executive function deficit  Other symptoms and signs involving the nervous system  Other symptoms and signs involving the musculoskeletal system  Other lack of coordination  Abnormal posture  Other abnormalities of gait and mobility  Unsteadiness on feet  Visuospatial deficit    Problem List Patient Active Problem List   Diagnosis Date Noted  . Hyperlipidemia 08/12/2018  . Mild  dementia ([place]) 05/24/2017  . Chest pain 04/03/2017  . Seasonal allergic rhinitis 02/27/2016  . Hoarseness 02/27/2016  . Mass of parotid gland 11/29/2015    [facility] 11/24/2018, 1:54 PM  [facility] [address] [place], [place], [address] Phone: [phone]   Fax:  [phone]  Name: Gavin Campbell MRN: [id number] Date of Birth: [birth year]   Vianne Bulls, OTR/L [facility] [address] [place], [place]  [address] [phone] phone [phone] 11/24/18 3:15 PM

## 2018-11-26 ENCOUNTER — Ambulatory Visit: Payer: Medicare PPO | Admitting: Physical Therapy

## 2018-11-26 ENCOUNTER — Ambulatory Visit: Payer: Medicare PPO | Admitting: Occupational Therapy

## 2018-12-03 ENCOUNTER — Encounter: Payer: Self-pay | Admitting: Physical Therapy

## 2018-12-03 ENCOUNTER — Ambulatory Visit: Payer: Medicare PPO | Admitting: Physical Therapy

## 2018-12-03 ENCOUNTER — Encounter: Payer: Self-pay | Admitting: Occupational Therapy

## 2018-12-03 ENCOUNTER — Ambulatory Visit: Payer: Medicare PPO | Admitting: Occupational Therapy

## 2018-12-03 DIAGNOSIS — R29898 Other symptoms and signs involving the musculoskeletal system: Secondary | ICD-10-CM

## 2018-12-03 DIAGNOSIS — R278 Other lack of coordination: Secondary | ICD-10-CM | POA: Diagnosis not present

## 2018-12-03 DIAGNOSIS — R29818 Other symptoms and signs involving the nervous system: Secondary | ICD-10-CM

## 2018-12-03 DIAGNOSIS — R41844 Frontal lobe and executive function deficit: Secondary | ICD-10-CM

## 2018-12-03 DIAGNOSIS — R41842 Visuospatial deficit: Secondary | ICD-10-CM

## 2018-12-03 DIAGNOSIS — R2689 Other abnormalities of gait and mobility: Secondary | ICD-10-CM

## 2018-12-03 DIAGNOSIS — R41840 Attention and concentration deficit: Secondary | ICD-10-CM

## 2018-12-03 DIAGNOSIS — R2681 Unsteadiness on feet: Secondary | ICD-10-CM

## 2018-12-03 DIAGNOSIS — R293 Abnormal posture: Secondary | ICD-10-CM

## 2018-12-03 NOTE — Therapy (Signed)
[facility] [address] [place], [place], [address] Phone: [phone]   Fax:  [phone]  Physical Therapy Treatment  Patient Details  Name: Gavin Campbell MRN: [id number] Date of Birth: 1935-07-08 Referring Provider (PT): Dr. Ellouise Newer   Encounter Date: 12/03/2018  PT End of Session - 12/03/18 1239    Visit Number  8    Number of Visits  16    Date for PT Re-Evaluation  12/19/18    Authorization Type  [employer]    PT Start Time  1148    PT Stop Time  1230    PT Time Calculation (min)  42 min    Activity Tolerance  Patient tolerated treatment well    Behavior During Therapy  [facility] for tasks assessed/performed       Past Medical History:  Diagnosis Date  . Blurred vision   . Chest pain 2007   ABNORMAL CARDIOLITE   . Coronary arteriosclerosis in native artery   . Diplopia   . Early satiety   . Hyperlipidemia   . Hypertension   . Left ventricular systolic dysfunction   . Orthostatic dizziness   . Parotid mass   . Pure hypercholesterolemia   . Unsteady gait   . Venous stasis dermatitis of right lower extremity     Past Surgical History:  Procedure Laterality Date  . CORONARY ANGIOPLASTY  2009   NON OBSTRUCTIVE DISEASE    There were no vitals filed for this visit.  Subjective Assessment - 12/03/18 1152    Subjective  Denies falls or changes.  Cancelled last week due to feeling stiff and not feeling well from a cognitive standpoint-"couldnt get it together".    Pertinent History  HTN, hyperlipidemia, bradycardia, mild dementia, diplopia    Patient Stated Goals  "Walk without bending over".      Currently in Pain?  No/denies   Did have shoulder pain before OT session but denies at present      [facility] Adult PT Treatment/Exercise - 12/03/18 0001      Transfers   Transfers  Sit to Stand;Stand to Sit    Sit to Stand  6: Modified independent (Device/Increase time)    Stand to Sit  6: Modified  independent (Device/Increase time)    Number of Reps  10 reps      Ambulation/Gait   Ambulation/Gait  Yes    Ambulation/Gait Assistance  5: Supervision    Ambulation Distance (Feet)  450 Feet   x 1 and 110' x 3   Assistive device  None;Other (Comment)   walking poles with pt holding one end & PTA holding other   Gait Pattern  Step-through pattern;Decreased arm swing - right;Decreased arm swing - left;Decreased step length - right;Decreased step length - left;Decreased stride length;Trunk flexed;Decreased trunk rotation;Poor foot clearance - left;Poor foot clearance - right    Ambulation Surface  Level;Indoor    Gait Comments  Used bilateral walking poles with therapist assist, to facilitate arm swing, with cues for increased step length and to look ahead for use of visual target for improved upright posture      Posture/Postural Control   Posture/Postural Control  Postural limitations    Postural Limitations  Rounded Shoulders;Forward head;Increased thoracic kyphosis;Posterior pelvic tilt    Posture Comments  Standing at wall with pillow behind head x 2 reps x 1 minute each with cues for chin tuck and to maintain posiition      Knee/Hip Exercises: Aerobic  Other Aerobic  Scifit level 1.5 bil LE's x 9 minutes with cues to keep intensity at >75 rpm. with one bout of >85 rpm        PWR [facility]) - 12/03/18 1226    PWR! Up  x 20    PWR! Rock  [employer]! Twist  x10    PWR Step  x20    Comments  Pt c/o R shoulder pain with twist to the right so did not continue with this particular exercise.  Needed cues for intensity and technique and posture with Moves.            PT Short Term Goals - 11/20/18 1459      PT SHORT TERM GOAL #1   Title  The patient will return demo HEP with assist from family (emphasizing posture, flexibility, large amplitude movements).    Baseline  reports performing 2x/wk (did not bring HEP folder in this week)    Time  4    Period  Weeks    Status   Partially Met      PT SHORT TERM GOAL #2   Title  The patient will improve gait speed from 2.16 ft/sec to > or equa lto 2.62 ft/sec to demo transition to "full community ambulator" classification of gait.    Baseline  2.61 ft/sec    Time  4    Period  Weeks    Status  Partially Met      PT SHORT TERM GOAL #3   Title  The patient will perform sit<>stand x 5 reps without UE support demonstrating improved initiation and power for sit<>stand.    Time  4    Period  Weeks    Status  Achieved      PT SHORT TERM GOAL #4   Title  The patient will subjective report decreased stiffness in the morning with neck stretching.    Baseline  Reports improved flexibility after exercises    Time  4    Period  Weeks    Status  Achieved      PT SHORT TERM GOAL #5   Title  The patient will be further assessed on Berg and goal to follow.    Baseline  45/56    Time  4    Period  Weeks    Status  Achieved        PT Long Term Goals - 11/10/18 1940      PT LONG TERM GOAL #1   Title  The patient will perform HEP progression with assist from family.    Time  8    Period  Weeks      PT LONG TERM GOAL #2   Title  The patient will improve 5 time sit<>stand to < or equal to 25 seconds to demo improving transitional movements.    Time  8    Period  Weeks      PT LONG TERM GOAL #3   Title  The patient will improve time to negotiate 4 steps from 18.28 seconds to < or equal to 14 seconds to demo improving functional mobility.    Time  8    Period  Weeks      PT LONG TERM GOAL #4   Title  The patient will improve total neck ROM for flexion + extension from 11 degrees up to 20 degrees of total movement.    Time  8    Period  Weeks  PT LONG TERM GOAL #5   Title  The patient will participate in home walking program with family for continued activity.    Time  8    Period  Weeks      PT LONG TERM GOAL #6   Title  Patient will improve Berg to >=48/56 to show improved balance.     Time  8     Period  Weeks    Status  New            Plan - 12/03/18 1239    Clinical Impression Statement  Pt continues with decreased intensity with movements and poor posture.  Continue PT per POC.    Rehab Potential  Good    PT Frequency  2x / week    PT Duration  8 weeks    PT Treatment/Interventions  ADLs/Self Care Home Management;Therapeutic activities;Therapeutic exercise;Neuromuscular re-education;Gait training;Stair training;Functional mobility training;Patient/family education;Manual techniques    PT Next Visit Plan  Determine if pt should schedule additional visits.  Standing PWR!  Gait working on step length and intensity; continue postural strengthening; discuss walking program and any options for community fitness.  Could pt use foot pedalar at home?    PT Home Exercise Plan  [id number] in [place] and Agree with Plan of Care  Patient;Family member/caregiver   wife      Patient will benefit from skilled therapeutic intervention in order to improve the following deficits and impairments:  Abnormal gait, Decreased range of motion, Postural dysfunction, Decreased balance, Impaired flexibility  Visit Diagnosis: Abnormal posture  Other abnormalities of gait and mobility     Problem List Patient Active Problem List   Diagnosis Date Noted  . Hyperlipidemia 08/12/2018  . Mild dementia ([place]) 05/24/2017  . Chest pain 04/03/2017  . Seasonal allergic rhinitis 02/27/2016  . Hoarseness 02/27/2016  . Mass of parotid gland 11/29/2015    Narda Bonds, PTA [place] 12/03/18 12:43 PM Phone: [phone] Fax: [place] [address] [place], [place], [address] Phone: [phone]   Fax:  [phone]  Name: Gavin Campbell MRN: [id number] Date of Birth: 10-26-1934

## 2018-12-03 NOTE — Therapy (Signed)
[facility] [address] [place], [place], [address] Phone: [phone]   Fax:  [phone]  Occupational Therapy Treatment  Patient Details  Name: Gavin Campbell MRN: [id number] Date of Birth: September 11, 1935 Referring Provider (OT): Dr. Ellouise Newer   Encounter Date: 12/03/2018  OT End of Session - 12/03/18 1102    Visit Number  12    Number of Visits  17    Date for OT Re-Evaluation  11/29/18    Authorization Type  [employer]-    Authorization Time Period  cert. 09/29/18-12/28/18    Authorization - Visit Number  12    Authorization - Number of Visits  20    OT Start Time  1104    OT Stop Time  1147    OT Time Calculation (min)  43 min    Activity Tolerance  Patient tolerated treatment well    Behavior During Therapy  WFL for tasks assessed/performed       Past Medical History:  Diagnosis Date  . Blurred vision   . Chest pain 2007   ABNORMAL CARDIOLITE   . Coronary arteriosclerosis in native artery   . Diplopia   . Early satiety   . Hyperlipidemia   . Hypertension   . Left ventricular systolic dysfunction   . Orthostatic dizziness   . Parotid mass   . Pure hypercholesterolemia   . Unsteady gait   . Venous stasis dermatitis of right lower extremity     Past Surgical History:  Procedure Laterality Date  . CORONARY ANGIOPLASTY  2009   NON OBSTRUCTIVE DISEASE    There were no vitals filed for this visit.  Subjective Assessment - 12/03/18 1105    Subjective   Pt reports soreness in R shoulder when he woke up Thursday morning    Pertinent History  Parkinsonism (diagnosed approx 6 months ago per pt); hypertension, hyperlipidemia, bradycardia, with mild dementia, diplopia    Patient Stated Goals  improve posture, stiffness, easier to dress    Currently in Pain?  Yes    Pain Score  6     Pain Location  Shoulder    Pain Orientation  Right    Pain Descriptors / Indicators  Sore    Pain Type  Acute  pain    Pain Onset  1 to 4 weeks ago    Pain Frequency  Intermittent    Aggravating Factors   rest     Pain Relieving Factors  movement       In sitting, AAROM shoulder flex with BUEs with ball with focus on posture and full ROM as able.  In modified quadruped with hands on table, PWR! Rock with min-mod cueing for incr movement amplitude.  Standing at counter PWR! Rock and reach for lateral wt. Shift, trunk activation/elongation, shoulder ROM to targets with min cueing for feet apart and incr movement amplitude.  Arm bike x20mn level 1 for reciprocal movement with cues/target of at least 30rpms for intensity while maintaining movement amplitude/reciprocal movement.   Pt maintained 28-30rpms  Flipping cards with each hand with min cueing for large amplitude and supination.  Placing grooved pegs in pegboard with each hand with mod difficulty, decr visual-perception appears to impact.  Began checking remaining goals:  9-hole peg test, PPT#4, and reviewed education--see goals section below.      OT Education - 12/03/18 1533    Education Details  Importance of regular exercises, recommended supine PWR! and/or Bilateral shoulder flex with shoebox/papertowels if  feeling stiff/sore or not feeling as well as inactivity will incr rigidity     Person(s) Educated  Patient;Spouse    Methods  Explanation;Demonstration;Verbal cues    Comprehension  Verbalized understanding       OT Short Term Goals - 12/03/18 1129      OT SHORT TERM GOAL #1   Title  Pt will be independent with PD-specific HEP to address posture, bradykinesia, rigidity, and coordination.--check STGs 10/29/18    Time  4    Period  Weeks    Status  Achieved   with wife assisting     OT SHORT TERM GOAL #2   Title  Pt will verbalize understanding of ways to prevent future complications related to PD and appropriate community resources.    Time  4    Period  Weeks    Status  Achieved   11/17/18:  educated in community  resources, but would benefit from review/additional education of decr risk of future complications     OT SHORT TERM GOAL #3   Title  Pt will improve coordination as shown by fastening/unfastening 3 buttons in less than 5mn.    Time  4    Period  Weeks    Status  On-going   11/17/18:  unable today      OT SHORT TERM GOAL #4   Title  Pt will improve ability to dress as shown by completing PPT #4 in less than 20sec.    Time  4    Period  Weeks    Status  Achieved   11/10/18:  30.75 secs, 12/03/18:  19.94sec     OT SHORT TERM GOAL #5   Title  Pt will improve standing functional reach by at least 2" bilaterally for improved balance for ADLs.      Baseline  R-6", L-7"    Time  4    Period  Weeks    Status  Partially Met   met for right, ongoing left  8.5 bilaterally     OT SHORT TERM GOAL #6   Title  Pt will report incr ease with donning/doffing socks and pull-over shirt.    Time  4    Period  Weeks    Status  Achieved   11/10/18       OT Long Term Goals - 12/03/18 1132      OT LONG TERM GOAL #1   Title  Pt will verbalize understanding of adaptive stratgies to incr. safety/ease/independence with ADLs/IADLs.--check LTGs 11/29/18    Time  8    Period  Weeks    Status  Partially Met   12/03/18:  will need cueing from wife due to cognition     OT LONG TERM GOAL #2   Title  Pt will verbalize understanding of memory compensation strategies and ways to keep thinking skills sharp.    Time  8    Period  Weeks    Status  Achieved   education provided, may need review.  12/03/18  met     OT LONG TERM GOAL #3   Title  Pt will improve functional reaching/coordination for ADLs as shown by improving score on box and blocks test by at least 6 blocks with dominant RUE.    Baseline  R-27 blocks, L-38 blocks    Time  8    Period  Weeks    Status  New      OT LONG TERM GOAL #4   Title  Pt will improve  coordination for ADLs as shown by improving time on 9-hole peg test by at least 3sec  bilaterally.    Baseline  R-35.56sec, L-34.97sec    Time  8    Period  Weeks    Status  Not Met   12/03/18:  R-38.97sec, L-38.41sec     OT LONG TERM GOAL #5   Title  Pt will be able to fasten/unfasten 3 buttons in less than 90sec.    Baseline  36mn, 5sec    Time  8    Period  Weeks    Status  New            Plan - 12/03/18 1103    Clinical Impression Statement  Pt reports that he had a bad day last week with attention and woke up the next day sore, so he hasn't been exercising.  Emphasized importance of movement/exercise even when not feeling well and recommended that pt do easier exercises or less exercise, but not to just sit.  Pt reports no R shoulder soreness at end of session.      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  Pt is now mod I with most ADLs, but receiving some assist due to effort required.  Pt is no longer working or driving.  Wife has also started doing financial management tasks.  Pt is less active socially since he stopped working and no longer walks in neighborhood like he did.    Rehab Potential  Good    Current Impairments/barriers affecting progress:  mild dementia    OT Frequency  2x / week    OT Duration  8 weeks    OT Treatment/Interventions  Self-care/ADL training;Therapeutic exercise;Visual/perceptual remediation/compensation;Patient/family education;Neuromuscular education;Paraffin;Moist Heat;Aquatic Therapy;Fluidtherapy;Energy conservation;FTherapist, nutritionalTherapeutic activities;Balance training;Cryotherapy;Ultrasound;DME and/or AE instruction;Manual Therapy;Passive range of motion;Cognitive remediation/compensation    Plan  check goals and anticipate d/c in next 1-2 sessions, schedule follow-up    Clinical Decision Making  Several treatment options, min-mod task modification necessary    Consulted and Agree with Plan of  Care  Patient;Family member/caregiver    Family Member Consulted  spouse       Patient will benefit from skilled therapeutic intervention in order to improve the following deficits and impairments:  Decreased cognition, Decreased knowledge of use of DME, Impaired flexibility, Pain, Decreased mobility, Decreased coordination, Improper spinal/pelvic alignment, Impaired tone, Decreased strength, Decreased range of motion, Decreased activity tolerance, Decreased endurance, Decreased balance, Decreased knowledge of precautions, Impaired UE functional use  Visit Diagnosis: Other symptoms and signs involving the nervous system  Frontal lobe and executive function deficit  Attention and concentration deficit  Unsteadiness on feet  Visuospatial deficit  Other symptoms and signs involving the musculoskeletal system  Other lack of coordination  Abnormal posture  Other abnormalities of gait and mobility    Problem List Patient Active Problem List   Diagnosis Date Noted  . Hyperlipidemia 08/12/2018  . Mild dementia [place] 05/24/2017  . Chest pain 04/03/2017  . Seasonal allergic rhinitis 02/27/2016  . Hoarseness 02/27/2016  . Mass of parotid gland 11/29/2015    [facility]2/19/2020, 3:41 PM  [address] [address]Phone: [phone]  Fax:  [phone] Name: Gavin KASSISMRN: [id number] of Birth: [birth year]  AVianne Bulls OTR/L [facility] [place]  [phone]  phone [phone] 12/03/18 3:41 PM

## 2018-12-04 ENCOUNTER — Ambulatory Visit: Payer: Medicare PPO | Admitting: Occupational Therapy

## 2018-12-04 ENCOUNTER — Ambulatory Visit: Payer: Medicare PPO | Admitting: Physical Therapy

## 2018-12-08 ENCOUNTER — Ambulatory Visit: Payer: Medicare PPO | Admitting: Physical Therapy

## 2018-12-08 ENCOUNTER — Encounter: Payer: Self-pay | Admitting: Physical Therapy

## 2018-12-08 DIAGNOSIS — R29818 Other symptoms and signs involving the nervous system: Secondary | ICD-10-CM | POA: Diagnosis not present

## 2018-12-08 DIAGNOSIS — R41840 Attention and concentration deficit: Secondary | ICD-10-CM | POA: Diagnosis not present

## 2018-12-08 DIAGNOSIS — R2689 Other abnormalities of gait and mobility: Secondary | ICD-10-CM

## 2018-12-08 DIAGNOSIS — R2681 Unsteadiness on feet: Secondary | ICD-10-CM

## 2018-12-08 DIAGNOSIS — R29898 Other symptoms and signs involving the musculoskeletal system: Secondary | ICD-10-CM | POA: Diagnosis not present

## 2018-12-08 DIAGNOSIS — R278 Other lack of coordination: Secondary | ICD-10-CM | POA: Diagnosis not present

## 2018-12-08 DIAGNOSIS — R41842 Visuospatial deficit: Secondary | ICD-10-CM | POA: Diagnosis not present

## 2018-12-08 DIAGNOSIS — R41844 Frontal lobe and executive function deficit: Secondary | ICD-10-CM | POA: Diagnosis not present

## 2018-12-08 DIAGNOSIS — R293 Abnormal posture: Secondary | ICD-10-CM

## 2018-12-08 NOTE — Therapy (Signed)
[facility] [address] [place], [place], [address] Phone: [phone]   Fax:  [phone]  Physical Therapy Treatment  Patient Details  Name: Gavin Campbell MRN: [id number] Date of Birth: [birth year] Referring Provider (PT): Dr. Ellouise Newer   Encounter Date: 12/08/2018  PT End of Session - 12/08/18 1455    Visit Number  9    Number of Visits  16    Date for PT Re-Evaluation  12/19/18    Authorization Type  [employer]    PT Start Time  1230    PT Stop Time  1314    PT Time Calculation (min)  44 min    Activity Tolerance  Patient tolerated treatment well    Behavior During Therapy  [facility] for tasks assessed/performed       Past Medical History:  Diagnosis Date  . Blurred vision   . Chest pain 2007   ABNORMAL CARDIOLITE   . Coronary arteriosclerosis in native artery   . Diplopia   . Early satiety   . Hyperlipidemia   . Hypertension   . Left ventricular systolic dysfunction   . Orthostatic dizziness   . Parotid mass   . Pure hypercholesterolemia   . Unsteady gait   . Venous stasis dermatitis of right lower extremity     Past Surgical History:  Procedure Laterality Date  . CORONARY ANGIOPLASTY  2009   NON OBSTRUCTIVE DISEASE    There were no vitals filed for this visit.  Subjective Assessment - 12/08/18 1233    Subjective  Denies falls or changes.  Didn't make the Thursday appointment due to the snowy weather.    Pertinent History  HTN, hyperlipidemia, bradycardia, mild dementia, diplopia    Patient Stated Goals  "Walk without bending over".      Currently in Pain?  No/denies         [facility] PT Assessment - 12/08/18 0001      AROM   Cervical Flexion  31   starting from neutral   Cervical Extension  15   from neutral     Transfers   Transfers  Sit to Stand;Stand to Sit    Sit to Stand  6: Modified independent (Device/Increase time);With upper extremity assist;From chair/3-in-1    Five  time sit to stand comments   19.97    Stand to Sit  6: Modified independent (Device/Increase time);With armrests;To chair/3-in-1      Ambulation/Gait   Ambulation/Gait  Yes    Ambulation/Gait Assistance  5: Supervision    Gait velocity  11.47 sec = 2.86 ft/sec      Standardized Balance Assessment   Standardized Balance Assessment  Timed Up and Go Test;Berg Balance Test      Berg Balance Test   Sit to Stand  Able to stand  independently using hands    Standing Unsupported  Able to stand safely 2 minutes    Sitting with Back Unsupported but Feet Supported on Floor or Stool  Able to sit safely and securely 2 minutes    Stand to Sit  Sits safely with minimal use of hands    Transfers  Able to transfer safely, minor use of hands    Standing Unsupported with Eyes Closed  Able to stand 10 seconds with supervision    Standing Ubsupported with Feet Together  Able to place feet together independently and stand 1 minute safely    From Standing, Reach Forward with Outstretched Arm  Can reach confidently >25  cm (10")    From Standing Position, Pick up Object from Faith to pick up shoe safely and easily    From Standing Position, Turn to Look Behind Over each Shoulder  Looks behind one side only/other side shows less weight shift   L>R   Turn 360 Degrees  Able to turn 360 degrees safely but slowly    Standing Unsupported, Alternately Place Feet on Step/Stool  Able to stand independently and safely and complete 8 steps in 20 seconds    Standing Unsupported, One Foot in Front  Able to plae foot ahead of the other independently and hold 30 seconds    Standing on One Leg  Able to lift leg independently and hold > 10 seconds    Total Score  50      Timed Up and Go Test   Normal TUG (seconds)  16                   [facility] Adult PT Treatment/Exercise - 12/08/18 0001      Ambulation/Gait   Ambulation Distance (Feet)  1025 Feet    Stairs  Yes    Stairs Assistance  6: Modified  independent (Device/Increase time)    Stair Management Technique  Two rails;Alternating pattern    Number of Stairs  4   15.21 sec, 11.91 sec              PT Short Term Goals - 11/20/18 1459      PT SHORT TERM GOAL #1   Title  The patient will return demo HEP with assist from family (emphasizing posture, flexibility, large amplitude movements).    Baseline  reports performing 2x/wk (did not bring HEP folder in this week)    Time  4    Period  Weeks    Status  Partially Met      PT SHORT TERM GOAL #2   Title  The patient will improve gait speed from 2.16 ft/sec to > or equa lto 2.62 ft/sec to demo transition to "full community ambulator" classification of gait.    Baseline  2.61 ft/sec    Time  4    Period  Weeks    Status  Partially Met      PT SHORT TERM GOAL #3   Title  The patient will perform sit<>stand x 5 reps without UE support demonstrating improved initiation and power for sit<>stand.    Time  4    Period  Weeks    Status  Achieved      PT SHORT TERM GOAL #4   Title  The patient will subjective report decreased stiffness in the morning with neck stretching.    Baseline  Reports improved flexibility after exercises    Time  4    Period  Weeks    Status  Achieved      PT SHORT TERM GOAL #5   Title  The patient will be further assessed on Berg and goal to follow.    Baseline  45/56    Time  4    Period  Weeks    Status  Achieved        PT Long Term Goals - 12/08/18 1254      PT LONG TERM GOAL #1   Title  The patient will perform HEP progression with assist from family.    Time  8    Period  Weeks      PT LONG TERM GOAL #2  Title  The patient will improve 5 time sit<>stand to < or equal to 25 seconds to demo improving transitional movements.    Time  8    Period  Weeks    Status  Achieved      PT LONG TERM GOAL #3   Title  The patient will improve time to negotiate 4 steps from 18.28 seconds to < or equal to 14 seconds to demo improving  functional mobility.    Baseline  11.97 sec at best    Time  8    Period  Weeks    Status  Achieved      PT LONG TERM GOAL #4   Title  The patient will improve total neck ROM for flexion + extension from 11 degrees up to 20 degrees of total movement.    Time  8    Period  Weeks    Status  Achieved      PT LONG TERM GOAL #5   Title  The patient will participate in home walking program with family for continued activity.    Time  8    Period  Weeks      PT LONG TERM GOAL #6   Title  Patient will improve Berg to >=48/56 to show improved balance.     Baseline  50/56 12/08/2018    Time  8    Period  Weeks    Status  Achieved            Plan - 12/08/18 1458    Clinical Impression Statement  Began assessing LTGs this visit, with pt meeting LTG 2, 3, 4, 6 for improved transfers, Berg, stair negotiation scores.  He has also improved gait velocity and TUG scores.  In sitting, with attention to posture, he is able to demonstrate improved flexibility in neck flexion and extension; however, with gait, he reverts back to more forward flexed posture.  Despite pt not making his 2x/wk frequency most weeks, he is demonstrateing improvement in most functional measures and feels comfortable to discharge next visit.    Rehab Potential  Good    PT Frequency  2x / week    PT Duration  8 weeks    PT Treatment/Interventions  ADLs/Self Care Home Management;Therapeutic activities;Therapeutic exercise;Neuromuscular re-education;Gait training;Stair training;Functional mobility training;Patient/family education;Manual techniques    PT Next Visit Plan  Hopefully wife will be present for review of HEP and walking program education (pt reports walking in neighborhood on his own); plan for d/c.  Need to schedule return screens    PT Home Exercise Plan  [id number] in [place] and Agree with Plan of Care  Patient       Patient will benefit from skilled therapeutic intervention in order to improve  the following deficits and impairments:  Abnormal gait, Decreased range of motion, Postural dysfunction, Decreased balance, Impaired flexibility  Visit Diagnosis: Unsteadiness on feet  Other abnormalities of gait and mobility  Abnormal posture     Problem List Patient Active Problem List   Diagnosis Date Noted  . Hyperlipidemia 08/12/2018  . Mild dementia ([place]) 05/24/2017  . Chest pain 04/03/2017  . Seasonal allergic rhinitis 02/27/2016  . Hoarseness 02/27/2016  . Mass of parotid gland 11/29/2015    Lashawne Dura W. 12/08/2018, 3:09 PM  Frazier Butt., PT  [facility] [address] [place], [place], [address] Phone: [phone]   Fax:  [phone]  Name: Gavin Campbell MRN: [id number] Date of  Birth: 15-Dec-1934

## 2018-12-10 ENCOUNTER — Encounter: Payer: Self-pay | Admitting: Physical Therapy

## 2018-12-10 ENCOUNTER — Ambulatory Visit: Payer: Medicare PPO | Admitting: Physical Therapy

## 2018-12-10 DIAGNOSIS — R2681 Unsteadiness on feet: Secondary | ICD-10-CM

## 2018-12-10 DIAGNOSIS — R29898 Other symptoms and signs involving the musculoskeletal system: Secondary | ICD-10-CM | POA: Diagnosis not present

## 2018-12-10 DIAGNOSIS — R41844 Frontal lobe and executive function deficit: Secondary | ICD-10-CM | POA: Diagnosis not present

## 2018-12-10 DIAGNOSIS — R41842 Visuospatial deficit: Secondary | ICD-10-CM | POA: Diagnosis not present

## 2018-12-10 DIAGNOSIS — R29818 Other symptoms and signs involving the nervous system: Secondary | ICD-10-CM

## 2018-12-10 DIAGNOSIS — R293 Abnormal posture: Secondary | ICD-10-CM | POA: Diagnosis not present

## 2018-12-10 DIAGNOSIS — R278 Other lack of coordination: Secondary | ICD-10-CM | POA: Diagnosis not present

## 2018-12-10 DIAGNOSIS — R2689 Other abnormalities of gait and mobility: Secondary | ICD-10-CM | POA: Diagnosis not present

## 2018-12-10 DIAGNOSIS — R41840 Attention and concentration deficit: Secondary | ICD-10-CM | POA: Diagnosis not present

## 2018-12-10 NOTE — Therapy (Signed)
[facility] [address] [place], [place], [address] Phone: [phone]   Fax:  [phone]  Physical Therapy Treatment  Patient Details  Name: Gavin Campbell MRN: [id number] Date of Birth: 1934/11/22 Referring Provider (PT): Dr. Ellouise Newer   Encounter Date: 12/10/2018  PT End of Session - 12/10/18 1611    Visit Number  10    Number of Visits  16    Date for PT Re-Evaluation  12/19/18    Authorization Type  [employer]    PT Start Time  5277    PT Stop Time  1400    PT Time Calculation (min)  43 min    Activity Tolerance  Patient tolerated treatment well    Behavior During Therapy  [facility] for tasks assessed/performed       Past Medical History:  Diagnosis Date  . Blurred vision   . Chest pain 2007   ABNORMAL CARDIOLITE   . Coronary arteriosclerosis in native artery   . Diplopia   . Early satiety   . Hyperlipidemia   . Hypertension   . Left ventricular systolic dysfunction   . Orthostatic dizziness   . Parotid mass   . Pure hypercholesterolemia   . Unsteady gait   . Venous stasis dermatitis of right lower extremity     Past Surgical History:  Procedure Laterality Date  . CORONARY ANGIOPLASTY  2009   NON OBSTRUCTIVE DISEASE    There were no vitals filed for this visit.  Subjective Assessment - 12/10/18 1319    Subjective  Pt and wife report they have seen improvement in mobility with therapy sessions.  Feels like he is getting up/down better, stiffness is better, doing more.    Pertinent History  HTN, hyperlipidemia, bradycardia, mild dementia, diplopia    Patient Stated Goals  "Walk without bending over".      Currently in Pain?  No/denies         [facility] Adult PT Treatment/Exercise - 12/10/18 0001      Self-Care   Self-Care  Other Self-Care Comments    Other Self-Care Comments   Discussed importance of HEP as well as walking program 3-5x/week for 30 minutes.  Reviewed information on PWR!  classes.  Discussed 6 month PD screen and had pt to schedule screen.      Knee/Hip Exercises: Stretches   Active Hamstring Stretch  Both;1 rep;30 seconds    Active Hamstring Stretch Limitations  Supine using black theraband with cues for optimal stretch      Knee/Hip Exercises: Aerobic   Other Aerobic  Seated foot pedal exerciser x 5 minutes as trial for option at home.        Knee/Hip Exercises: Standing   Other Standing Knee Exercises  sit<>stand emphasizing large amplitude movement and exaggerated upright posture x 10 reps.      Standing at wall for posture with pillow behind head and verbal cues for technique x 1 minute x 2 reps.  [facility]) - 12/10/18 1605    PWR! exercises  Moves in standing    PWR! Up  x 20    PWR! Rock  x 20    PWR! Twist  x 20    PWR Step  x 20    Comments  Continued to need cues for intensity.           PT Education - 12/10/18 1610    Education Details  Screen in 6 months, walking program, importance of HEP, PWR! classes  Person(s) Educated  Patient;Spouse    Methods  Explanation    Comprehension  Verbalized understanding       PT Short Term Goals - 11/20/18 1459      PT SHORT TERM GOAL #1   Title  The patient will return demo HEP with assist from family (emphasizing posture, flexibility, large amplitude movements).    Baseline  reports performing 2x/wk (did not bring HEP folder in this week)    Time  4    Period  Weeks    Status  Partially Met      PT SHORT TERM GOAL #2   Title  The patient will improve gait speed from 2.16 ft/sec to > or equa lto 2.62 ft/sec to demo transition to "full community ambulator" classification of gait.    Baseline  2.61 ft/sec    Time  4    Period  Weeks    Status  Partially Met      PT SHORT TERM GOAL #3   Title  The patient will perform sit<>stand x 5 reps without UE support demonstrating improved initiation and power for sit<>stand.    Time  4    Period  Weeks    Status  Achieved      PT SHORT TERM  GOAL #4   Title  The patient will subjective report decreased stiffness in the morning with neck stretching.    Baseline  Reports improved flexibility after exercises    Time  4    Period  Weeks    Status  Achieved      PT SHORT TERM GOAL #5   Title  The patient will be further assessed on Berg and goal to follow.    Baseline  45/56    Time  4    Period  Weeks    Status  Achieved        PT Long Term Goals - 12/10/18 1611      PT LONG TERM GOAL #1   Title  The patient will perform HEP progression with assist from family.    Time  8    Period  Weeks    Status  Achieved      PT LONG TERM GOAL #2   Title  The patient will improve 5 time sit<>stand to < or equal to 25 seconds to demo improving transitional movements.    Time  8    Period  Weeks    Status  Achieved      PT LONG TERM GOAL #3   Title  The patient will improve time to negotiate 4 steps from 18.28 seconds to < or equal to 14 seconds to demo improving functional mobility.    Baseline  11.97 sec at best    Time  8    Period  Weeks    Status  Achieved      PT LONG TERM GOAL #4   Title  The patient will improve total neck ROM for flexion + extension from 11 degrees up to 20 degrees of total movement.    Time  8    Period  Weeks    Status  Achieved      PT LONG TERM GOAL #5   Title  The patient will participate in home walking program with family for continued activity.    Baseline  12/10/18-pt reports he is currently walking 15 minutes (1 mile) several times a week.    Time  8    Period  Weeks  Status  Achieved      PT LONG TERM GOAL #6   Title  Patient will improve Berg to >=48/56 to show improved balance.     Baseline  50/56 12/08/2018    Time  8    Period  Weeks    Status  Achieved            Plan - 12/10/18 1612    Clinical Impression Statement  Pt has met all LTG's.  Pt and wife report seeing significant improvements in mobility since starting PT.  Discharge per Mady Haagensen, PT and screen in  6 months.    Rehab Potential  Good    PT Frequency  2x / week    PT Duration  8 weeks    PT Treatment/Interventions  ADLs/Self Care Home Management;Therapeutic activities;Therapeutic exercise;Neuromuscular re-education;Gait training;Stair training;Functional mobility training;Patient/family education;Manual techniques    PT Next Visit Plan  Discharge from PT per Mady Haagensen, PT    PT Home Exercise Plan  [id number] in [place] and Agree with Plan of Care  Patient;Family member/caregiver       Patient will benefit from skilled therapeutic intervention in order to improve the following deficits and impairments:  Abnormal gait, Decreased range of motion, Postural dysfunction, Decreased balance, Impaired flexibility  Visit Diagnosis: Unsteadiness on feet  Other abnormalities of gait and mobility  Abnormal posture  Other symptoms and signs involving the nervous system     Problem List Patient Active Problem List   Diagnosis Date Noted  . Hyperlipidemia 08/12/2018  . Mild dementia ([place]) 05/24/2017  . Chest pain 04/03/2017  . Seasonal allergic rhinitis 02/27/2016  . Hoarseness 02/27/2016  . Mass of parotid gland 11/29/2015    Narda Bonds, PTA [facility] 12/10/18 4:15 PM Phone: [phone] Fax: [facility] [address] [place], [place], [address] Phone: [phone]   Fax:  [phone]  Name: Gavin Campbell MRN: [id number] Date of Birth: 1935/08/08

## 2018-12-23 ENCOUNTER — Ambulatory Visit: Payer: Medicare PPO | Attending: Family Medicine | Admitting: Occupational Therapy

## 2018-12-23 ENCOUNTER — Encounter: Payer: Self-pay | Admitting: Occupational Therapy

## 2018-12-23 DIAGNOSIS — R29818 Other symptoms and signs involving the nervous system: Secondary | ICD-10-CM | POA: Insufficient documentation

## 2018-12-23 DIAGNOSIS — R29898 Other symptoms and signs involving the musculoskeletal system: Secondary | ICD-10-CM | POA: Diagnosis not present

## 2018-12-23 DIAGNOSIS — R41840 Attention and concentration deficit: Secondary | ICD-10-CM | POA: Insufficient documentation

## 2018-12-23 DIAGNOSIS — R2681 Unsteadiness on feet: Secondary | ICD-10-CM | POA: Diagnosis not present

## 2018-12-23 DIAGNOSIS — R41842 Visuospatial deficit: Secondary | ICD-10-CM | POA: Diagnosis not present

## 2018-12-23 DIAGNOSIS — R278 Other lack of coordination: Secondary | ICD-10-CM | POA: Diagnosis not present

## 2018-12-23 DIAGNOSIS — R2689 Other abnormalities of gait and mobility: Secondary | ICD-10-CM | POA: Insufficient documentation

## 2018-12-23 DIAGNOSIS — R41844 Frontal lobe and executive function deficit: Secondary | ICD-10-CM | POA: Diagnosis not present

## 2018-12-23 DIAGNOSIS — R293 Abnormal posture: Secondary | ICD-10-CM | POA: Insufficient documentation

## 2018-12-23 NOTE — Patient Instructions (Addendum)
Ways to prevent future Parkinson's related complications:  1.   Exercise regularly.  Perform your therapy exercises and incorporate safe aerobic exercise when possible (swimming, stationary bike, arm bike, seated stepper)  2.   Focus on BIGGER movements during daily activities- really reach overhead, straighten elbows and extend fingers  3.   When dressing or reaching for your seatbelt make sure to use your body to assist by twisting and looking at where you are reaching while you reach--this can help to minimize stress on the shoulder and reduce the risk of a rotator cuff tear  4.   Anytime you reach or move shoulder, make sure you have good upright posture.  5.  When you reach for something overhead, make sure your thumb is facing up.  This is a better position for your shoulder.  6.  Swing your arms when you walk!  People with PD are at increased risk for frozen shoulder and swinging your arms can reduce this risk.  7.  Keep you feet apart when you are standing to allow you to have better balance and reach further (which can help with shoulder rigidity).  Also make sure your feet are apart when you are sitting before you stand up.                        Overhead Arm Extension - Supine     Lay down.  Hold a ball/shoe box/pillow (shoulder width sized) on tops of legs with arms straight and hands flat on either side. Slowly move arms up/back in an arc with elbows straight and then slowly lower back down.   KEEP ELBOWS STRAIGHT, ONLY AS FAR AS YOU CAN WITHOUT PAIN. Repeat 10 times.    2x/day

## 2018-12-24 NOTE — Therapy (Signed)
[facility] [address] [place], [place], [address] Phone: [phone]   Fax:  [phone]  Occupational Therapy Treatment  Patient Details  Name: Gavin Campbell MRN: [id number] Date of Birth: 06-25-1935 Referring Provider (OT): Dr. Ellouise Newer   Encounter Date: 12/23/2018  OT End of Session - 12/23/18 1025    Visit Number  13    Number of Visits  17    Date for OT Re-Evaluation  11/29/18    Authorization Type  [employer]-    Authorization Time Period  cert. 09/29/18-12/28/18    Authorization - Visit Number  13    Authorization - Number of Visits  20    OT Start Time  1021    OT Stop Time  1100    OT Time Calculation (min)  39 min    Activity Tolerance  Patient tolerated treatment well    Behavior During Therapy  WFL for tasks assessed/performed       Past Medical History:  Diagnosis Date  . Blurred vision   . Chest pain 2007   ABNORMAL CARDIOLITE   . Coronary arteriosclerosis in native artery   . Diplopia   . Early satiety   . Hyperlipidemia   . Hypertension   . Left ventricular systolic dysfunction   . Orthostatic dizziness   . Parotid mass   . Pure hypercholesterolemia   . Unsteady gait   . Venous stasis dermatitis of right lower extremity     Past Surgical History:  Procedure Laterality Date  . CORONARY ANGIOPLASTY  2009   NON OBSTRUCTIVE DISEASE    There were no vitals filed for this visit.  Subjective Assessment - 12/23/18 1019    Subjective   Pt reports pain in R shoulder when he raises it sometimes.    Pertinent History  Parkinsonism (diagnosed approx 6 months ago per pt); hypertension, hyperlipidemia, bradycardia, with mild dementia, diplopia    Patient Stated Goals  improve posture, stiffness, easier to dress    Currently in Pain?  Yes    Pain Score  6     Pain Location  Shoulder    Pain Orientation  Right    Pain Descriptors / Indicators  Sore    Pain Type  Acute pain     Pain Onset  1 to 4 weeks ago    Aggravating Factors   rest, proper positioning    Pain Relieving Factors  improper positioning      Reviewed PWR! Up, rock, twist in supine with min cueing for proper positioning and technique.    Checked remaining goals and discussed progress.  Recommended screen in approx 6 months (scheduled) and discussed indications for earlier return to therapy (to request referral from MD).      OT Education - 12/24/18 0114    Education Details  Ways to decr risk of future complications and decr R shoulder pain--see pt instructions.  Supine shoulder flex BUEs with ball added to HEP (no pain with proper positioning/elbow ext)--recommended pt perform every morning to improve stiffness/pain as pain improves with proper positioning and stretching    Person(s) Educated  Patient;Spouse    Methods  Explanation;Demonstration;Verbal cues;Handout    Comprehension  Verbalized understanding;Returned demonstration;Verbal cues required   wife able to provide cueing       OT Short Term Goals - 12/24/18 0118      OT SHORT TERM GOAL #1   Title  Pt will be independent with PD-specific HEP to address posture,  bradykinesia, rigidity, and coordination.--check STGs 10/29/18    Time  4    Period  Weeks    Status  Achieved   with wife assisting     OT SHORT TERM GOAL #2   Title  Pt will verbalize understanding of ways to prevent future complications related to PD and appropriate community resources.    Time  4    Period  Weeks    Status  Achieved   11/17/18:  educated in community resources, but would benefit from review/additional education of decr risk of future complications     OT SHORT TERM GOAL #3   Title  Pt will improve coordination as shown by fastening/unfastening 3 buttons in less than 29mn.    Time  4    Period  Weeks    Status  Not Met   11/17/18:  unable today      OT SHORT TERM GOAL #4   Title  Pt will improve ability to dress as shown by completing PPT #4 in less  than 20sec.    Time  4    Period  Weeks    Status  Achieved   11/10/18:  30.75 secs, 12/03/18:  19.94sec     OT SHORT TERM GOAL #5   Title  Pt will improve standing functional reach by at least 2" bilaterally for improved balance for ADLs.      Baseline  R-6", L-7"    Time  4    Period  Weeks    Status  Not Met   met for right, ongoing left  8.5 bilaterally.  12/23/18:  R-6.5", L-7"     OT SHORT TERM GOAL #6   Title  Pt will report incr ease with donning/doffing socks and pull-over shirt.    Time  4    Period  Weeks    Status  Achieved   11/10/18       OT Long Term Goals - 12/23/18 1055      OT LONG TERM GOAL #1   Title  Pt will verbalize understanding of adaptive stratgies to incr. safety/ease/independence with ADLs/IADLs.--check LTGs 11/29/18    Time  8    Period  Weeks    Status  Partially Met   12/03/18:  will need cueing from wife due to cognition     OT LONG TERM GOAL #2   Title  Pt will verbalize understanding of memory compensation strategies and ways to keep thinking skills sharp.    Time  8    Period  Weeks    Status  Achieved   education provided, may need review.  12/03/18  met     OT LONG TERM GOAL #3   Title  Pt will improve functional reaching/coordination for ADLs as shown by improving score on box and blocks test by at least 6 blocks with dominant RUE.    Baseline  R-27 blocks, L-38 blocks    Time  8    Period  Weeks    Status  Achieved   12/23/18:  R-39 blocks, L-38 blocks     OT LONG TERM GOAL #4   Title  Pt will improve coordination for ADLs as shown by improving time on 9-hole peg test by at least 3sec bilaterally.    Baseline  R-35.56sec, L-34.97sec    Time  8    Period  Weeks    Status  Partially Met   12/03/18:  R-38.97sec, L-38.41sec.  12/23/18:  R-31.0sec, L-35.35sec (met with R hand)  OT LONG TERM GOAL #5   Title  Pt will be able to fasten/unfasten 3 buttons in less than 90sec.    Baseline  4mn, 5sec    Time  8    Period  Weeks     Status  Deferred            Plan - 12/24/18 0117    Clinical Impression Statement  Pain improved in R shoulder with proper positioning and stretching--reinforced this.  Pt scheduled for follow-up screen in approx 6 months and was educated in indication for earlier return with MD referral.  Pt has made progress with RUE coordination and ADLs with use of strategies.    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  Pt is now mod I with most ADLs, but receiving some assist due to effort required.  Pt is no longer working or driving.  Wife has also started doing financial management tasks.  Pt is less active socially since he stopped working and no longer walks in neighborhood like he did.    Rehab Potential  Good    Clinical Decision Making  Several treatment options, min-mod task modification necessary    OT Frequency  2x / week    OT Duration  8 weeks    OT Treatment/Interventions  Self-care/ADL training;Therapeutic exercise;Visual/perceptual remediation/compensation;Patient/family education;Neuromuscular education;Paraffin;Moist Heat;Aquatic Therapy;Fluidtherapy;Energy conservation;FTherapist, nutritionalTherapeutic activities;Balance training;Cryotherapy;Ultrasound;DME and/or AE instruction;Manual Therapy;Passive range of motion;Cognitive remediation/compensation    Plan  d/c OT; recommended screen in approx 6 months.  Pt/wife verbalized agreement    Consulted and Agree with Plan of Care  Patient;Family member/caregiver    Family Member Consulted  spouse       Patient will benefit from skilled therapeutic intervention in order to improve the following deficits and impairments:     Visit Diagnosis: Other symptoms and signs involving the nervous system  Frontal lobe and executive function deficit  Attention and concentration deficit  Visuospatial  deficit  Abnormal posture  Other abnormalities of gait and mobility  Unsteadiness on feet  Other symptoms and signs involving the musculoskeletal system  Other lack of coordination    Problem List Patient Active Problem List   Diagnosis Date Noted  . Hyperlipidemia 08/12/2018  . Mild dementia ([place] 05/24/2017  . Chest pain 04/03/2017  . Seasonal allergic rhinitis 02/27/2016  . Hoarseness 02/27/2016  . Mass of parotid gland 11/29/2015    OCCUPATIONAL THERAPY DISCHARGE SUMMARY  Visits from Start of Care: 13  Current functional level related to goals / functional outcomes: See above   Remaining deficits: Bradykinesia, rigidity, decr coordination, abnormal posture, decr balance/functional mobility, cognitive deficits, intermittent R shoulder pain--pt continue to address with HEP/therapy recommendations    Education / Equipment: Pt was instructed in the following:  PD-specific HEP, adaptive strategies for ADLs/IADLs, ways to prevent future complications, appropriate community resources, memory compensation strategies/ways to improve cognitive skills.  Pt verbalized understanding of all education provided.    Plan: Patient agrees to discharge.  Patient goals were partially met. Patient is being discharged due to                                                     Reaching maximal rehab potential at this time.  Pt would benefit  from occupational therapy screen in approx 6 months to assess for need for further therapy/functional changes due to progressive nature of diagnosis. ?????        [facility] 12/24/2018, 1:21 AM  [facility] [address] [place], [place], [address] Phone: [phone]   Fax:  [phone]  Name: Gavin Campbell MRN: [id number] Date of Birth: [birth year]   Vianne Bulls, OTR/L [facility] [address] [place], [place]  [address] [phone]  phone [phone] 12/24/18 1:22 AM

## 2018-12-30 ENCOUNTER — Encounter: Payer: Medicare PPO | Admitting: Occupational Therapy

## 2018-12-31 ENCOUNTER — Encounter: Payer: Self-pay | Admitting: Physical Therapy

## 2018-12-31 NOTE — Therapy (Signed)
[facility] [address] [place], [place], [address] Phone: [phone]   Fax:  [phone]  Patient Details  Name: Gavin Campbell MRN: [id number] Date of Birth: 10/19/1934 Referring Provider:  No ref. provider found  Encounter Date: 12/31/2018  PHYSICAL THERAPY DISCHARGE SUMMARY  Visits from Start of Care: 10  Current functional level related to goals / functional outcomes: PT Long Term Goals - 12/10/18 1611      PT LONG TERM GOAL #1   Title  The patient will perform HEP progression with assist from family.    Time  8    Period  Weeks    Status  Achieved      PT LONG TERM GOAL #2   Title  The patient will improve 5 time sit<>stand to < or equal to 25 seconds to demo improving transitional movements.    Time  8    Period  Weeks    Status  Achieved      PT LONG TERM GOAL #3   Title  The patient will improve time to negotiate 4 steps from 18.28 seconds to < or equal to 14 seconds to demo improving functional mobility.    Baseline  11.97 sec at best    Time  8    Period  Weeks    Status  Achieved      PT LONG TERM GOAL #4   Title  The patient will improve total neck ROM for flexion + extension from 11 degrees up to 20 degrees of total movement.    Time  8    Period  Weeks    Status  Achieved      PT LONG TERM GOAL #5   Title  The patient will participate in home walking program with family for continued activity.    Baseline  12/10/18-pt reports he is currently walking 15 minutes (1 mile) several times a week.    Time  8    Period  Weeks    Status  Achieved      PT LONG TERM GOAL #6   Title  Patient will improve Berg to >=48/56 to show improved balance.     Baseline  50/56 12/08/2018    Time  8    Period  Weeks    Status  Achieved      Pt has met all LTGs.  Pt and wife report noting improved functional mobility during course of therapy.    PT Short Term Goals - 11/20/18 1459      PT SHORT TERM GOAL  #1   Title  The patient will return demo HEP with assist from family (emphasizing posture, flexibility, large amplitude movements).    Baseline  reports performing 2x/wk (did not bring HEP folder in this week)    Time  4    Period  Weeks    Status  Partially Met      PT SHORT TERM GOAL #2   Title  The patient will improve gait speed from 2.16 ft/sec to > or equa lto 2.62 ft/sec to demo transition to "full community ambulator" classification of gait.    Baseline  2.61 ft/sec    Time  4    Period  Weeks    Status  Partially Met      PT SHORT TERM GOAL #3   Title  The patient will perform sit<>stand x 5 reps without UE support demonstrating improved initiation and power for sit<>stand.      Time  4    Period  Weeks    Status  Achieved      PT SHORT TERM GOAL #4   Title  The patient will subjective report decreased stiffness in the morning with neck stretching.    Baseline  Reports improved flexibility after exercises    Time  4    Period  Weeks    Status  Achieved      PT SHORT TERM GOAL #5   Title  The patient will be further assessed on Berg and goal to follow.    Baseline  45/56    Time  4    Period  Weeks    Status  Achieved       Remaining deficits: Posture, balance   Education / Equipment: Educated in HEP-return demo understanding. Plan: Patient agrees to discharge.  Patient goals were not met. Patient is being discharged due to meeting the stated rehab goals.  ?????       , W. 12/31/2018, 1:32 PM   , PT 12/31/18 1:34 PM Phone: [phone] Fax: [phone]   [facility] [address] [place], [place], [address] Phone: [phone]   Fax:  [phone] 

## 2019-01-26 ENCOUNTER — Ambulatory Visit: Payer: Medicare PPO | Admitting: Neurology

## 2019-02-02 DIAGNOSIS — R972 Elevated prostate specific antigen [PSA]: Secondary | ICD-10-CM | POA: Diagnosis not present

## 2019-02-11 DIAGNOSIS — N5201 Erectile dysfunction due to arterial insufficiency: Secondary | ICD-10-CM | POA: Diagnosis not present

## 2019-02-11 DIAGNOSIS — R972 Elevated prostate specific antigen [PSA]: Secondary | ICD-10-CM | POA: Diagnosis not present

## 2019-02-11 DIAGNOSIS — R351 Nocturia: Secondary | ICD-10-CM | POA: Diagnosis not present

## 2019-02-11 DIAGNOSIS — N401 Enlarged prostate with lower urinary tract symptoms: Secondary | ICD-10-CM | POA: Diagnosis not present

## 2019-02-17 ENCOUNTER — Ambulatory Visit: Payer: Medicare PPO | Admitting: Sports Medicine

## 2019-02-23 ENCOUNTER — Telehealth: Payer: Self-pay | Admitting: *Deleted

## 2019-02-23 NOTE — Telephone Encounter (Signed)
Pt called to confirm his appt.

## 2019-02-24 ENCOUNTER — Ambulatory Visit: Payer: Medicare PPO | Admitting: Sports Medicine

## 2019-02-24 ENCOUNTER — Other Ambulatory Visit: Payer: Self-pay

## 2019-02-24 ENCOUNTER — Encounter: Payer: Self-pay | Admitting: Sports Medicine

## 2019-02-24 VITALS — Temp 97.6°F

## 2019-02-24 DIAGNOSIS — F03A0 Unspecified dementia, mild, without behavioral disturbance, psychotic disturbance, mood disturbance, and anxiety: Secondary | ICD-10-CM

## 2019-02-24 DIAGNOSIS — B351 Tinea unguium: Secondary | ICD-10-CM

## 2019-02-24 DIAGNOSIS — M79675 Pain in left toe(s): Secondary | ICD-10-CM | POA: Diagnosis not present

## 2019-02-24 DIAGNOSIS — M79674 Pain in right toe(s): Secondary | ICD-10-CM

## 2019-02-24 DIAGNOSIS — F0390 Unspecified dementia without behavioral disturbance: Secondary | ICD-10-CM | POA: Diagnosis not present

## 2019-02-24 NOTE — Progress Notes (Signed)
Patient ID: Gavin Campbell, male   DOB: 1935-02-05, 84 y.o.   MRN: [id number] Subjective: Gavin Campbell is a 84 y.o. male patient seen today in office with complaint of painful thickened and elongated toenails; unable to trim. Patient denies any changes to medical history, no new issues.   Patient Active Problem List   Diagnosis Date Noted  . Hyperlipidemia 08/12/2018  . Mild dementia ([place]) 05/24/2017  . Chest pain 04/03/2017  . Seasonal allergic rhinitis 02/27/2016  . Hoarseness 02/27/2016  . Mass of parotid gland 11/29/2015    Current Outpatient Medications on File Prior to Visit  Medication Sig Dispense Refill  . amLODipine (NORVASC) 2.5 MG tablet Take 2.5 mg by mouth daily.    Marland Kitchen aspirin 81 MG tablet Take 81 mg by mouth daily.    . carbidopa-levodopa (SINEMET IR) 25-100 MG tablet Take 1/2 tablet three times a day with meals 30 tablet 11  . Coenzyme Q10 10 MG capsule Take 10 mg by mouth daily.    Marland Kitchen desoximetasone (TOPICORT) 0.25 % cream MASSAGE ON RASH D  5  . memantine (NAMENDA) 5 MG tablet Take 1 tablet twice a day 180 tablet 3  . metoprolol succinate (TOPROL-XL) 25 MG 24 hr tablet Take 25 mg by mouth daily.    . mirtazapine (REMERON) 15 MG tablet TK 1 T PO QPM HS    . ranitidine (ZANTAC) 150 MG tablet Take 150 mg by mouth at bedtime.    . simvastatin (ZOCOR) 20 MG tablet Take 20 mg by mouth daily.    . TRIAMCINOLONE ACETONIDE EX Apply topically 2 (two) times daily.     No current facility-administered medications on file prior to visit.     No Known Allergies  Objective: Physical Exam  General: Well developed, nourished, no acute distress, awake, alert and oriented x 3  Vascular: Dorsalis pedis artery 2/4 bilateral, Posterior tibial artery 1/4 bilateral, skin temperature warm to warm proximal to distal bilateral lower extremities, no varicosities, pedal hair present bilateral.  Neurological: Gross sensation present via light touch bilateral.   Dermatological: Skin is  warm, dry, and supple bilateral, Nails 1-10 are tender, long, thick, and discolored with mild subungal debris, no webspace macerations present bilateral, no open lesions present bilateral, no callus/corns/hyperkeratotic tissue present bilateral. No signs of infection bilateral.  Musculoskeletal: Asymptomatic Bunion and hammertoe deformities noted bilateral. Muscular strength within normal limits without painon range of motion. No pain with calf compression bilateral.  Assessment and Plan:  Problem List Items Addressed This Visit      Nervous and Auditory   Mild dementia ([place])    Other Visit Diagnoses    Dermatophytosis of nail    -  Primary   Toe pain, bilateral          -Examined patient.  -Mechanically debrided and reduced mycotic nails with sterile nail nipper and dremel nail file without incident. -Encouraged skin emollients as needed for dry skin -Patient to return in 3 months for follow up evaluation or sooner if symptoms worsen.  Landis Martins, DPM

## 2019-03-20 ENCOUNTER — Other Ambulatory Visit: Payer: Self-pay | Admitting: Neurology

## 2019-04-08 ENCOUNTER — Telehealth (INDEPENDENT_AMBULATORY_CARE_PROVIDER_SITE_OTHER): Payer: Medicare PPO | Admitting: Neurology

## 2019-04-08 ENCOUNTER — Other Ambulatory Visit: Payer: Self-pay

## 2019-04-08 DIAGNOSIS — F0390 Unspecified dementia without behavioral disturbance: Secondary | ICD-10-CM | POA: Diagnosis not present

## 2019-04-08 DIAGNOSIS — G20 Parkinson's disease: Secondary | ICD-10-CM

## 2019-04-08 DIAGNOSIS — F03A0 Unspecified dementia, mild, without behavioral disturbance, psychotic disturbance, mood disturbance, and anxiety: Secondary | ICD-10-CM

## 2019-04-08 MED ORDER — CARBIDOPA-LEVODOPA 25-100 MG PO TABS: ORAL_TABLET | ORAL | 11 refills | Status: AC

## 2019-04-08 NOTE — Progress Notes (Signed)
Directive information placed in the mail for pt
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
## 2019-06-01 ENCOUNTER — Emergency Department (HOSPITAL_COMMUNITY)
Admission: EM | Admit: 2019-06-01 | Discharge: 2019-06-01 | Disposition: A | Discharge status: Home / Self Care | Payer: Medicare PPO | Attending: Emergency Medicine | Admitting: Emergency Medicine

## 2019-06-01 ENCOUNTER — Encounter (HOSPITAL_COMMUNITY): Payer: Self-pay

## 2019-06-01 ENCOUNTER — Other Ambulatory Visit: Payer: Self-pay

## 2019-06-01 DIAGNOSIS — Z20828 Contact with and (suspected) exposure to other viral communicable diseases: Secondary | ICD-10-CM | POA: Insufficient documentation

## 2019-06-01 DIAGNOSIS — I10 Essential (primary) hypertension: Secondary | ICD-10-CM | POA: Insufficient documentation

## 2019-06-01 DIAGNOSIS — F0390 Unspecified dementia without behavioral disturbance: Secondary | ICD-10-CM | POA: Diagnosis not present

## 2019-06-01 DIAGNOSIS — R21 Rash and other nonspecific skin eruption: Secondary | ICD-10-CM | POA: Insufficient documentation

## 2019-06-01 DIAGNOSIS — Z79899 Other long term (current) drug therapy: Secondary | ICD-10-CM | POA: Insufficient documentation

## 2019-06-01 DIAGNOSIS — Z87891 Personal history of nicotine dependence: Secondary | ICD-10-CM | POA: Diagnosis not present

## 2019-06-01 DIAGNOSIS — Z7982 Long term (current) use of aspirin: Secondary | ICD-10-CM | POA: Insufficient documentation

## 2019-06-01 DIAGNOSIS — L120 Bullous pemphigoid: Secondary | ICD-10-CM | POA: Diagnosis not present

## 2019-06-01 DIAGNOSIS — L100 Pemphigus vulgaris: Secondary | ICD-10-CM

## 2019-06-01 DIAGNOSIS — Z03818 Encounter for observation for suspected exposure to other biological agents ruled out: Secondary | ICD-10-CM | POA: Diagnosis not present

## 2019-06-01 LAB — COMPREHENSIVE METABOLIC PANEL
ALT: 17 U/L (ref 0–44)
AST: 25 U/L (ref 15–41)
Albumin: 4.3 g/dL (ref 3.5–5.0)
Alkaline Phosphatase: 41 U/L (ref 38–126)
Anion gap: 12 (ref 5–15)
BUN: 24 mg/dL — ABNORMAL HIGH (ref 8–23)
CO2: 22 mmol/L (ref 22–32)
Calcium: 9.5 mg/dL (ref 8.9–10.3)
Chloride: 104 mmol/L (ref 98–111)
Creatinine, Ser: 1.58 mg/dL — ABNORMAL HIGH (ref 0.61–1.24)
GFR calc Af Amer: 47 mL/min — ABNORMAL LOW (ref >60)
GFR calc non Af Amer: 40 mL/min — ABNORMAL LOW (ref >60)
Glucose, Bld: 105 mg/dL — ABNORMAL HIGH (ref 70–99)
Potassium: 3.6 mmol/L (ref 3.5–5.1)
Sodium: 138 mmol/L (ref 135–145)
Total Bilirubin: 1.2 mg/dL (ref 0.3–1.2)
Total Protein: 7.1 g/dL (ref 6.5–8.1)

## 2019-06-01 LAB — CBC WITH DIFFERENTIAL/PLATELET
Abs Immature Granulocytes: 0.04 10*3/uL (ref 0.00–0.07)
Basophils Absolute: 0.1 10*3/uL (ref 0.0–0.1)
Basophils Relative: 0 %
Eosinophils Absolute: 1.5 10*3/uL — ABNORMAL HIGH (ref 0.0–0.5)
Eosinophils Relative: 11 %
HCT: 46.1 % (ref 39.0–52.0)
Hemoglobin: 14.5 g/dL (ref 13.0–17.0)
Immature Granulocytes: 0 %
Lymphocytes Relative: 8 %
Lymphs Abs: 1 10*3/uL (ref 0.7–4.0)
MCH: 28.7 pg (ref 26.0–34.0)
MCHC: 31.5 g/dL (ref 30.0–36.0)
MCV: 91.1 fL (ref 80.0–100.0)
Monocytes Absolute: 1 10*3/uL (ref 0.1–1.0)
Monocytes Relative: 7 %
Neutro Abs: 9.8 10*3/uL — ABNORMAL HIGH (ref 1.7–7.7)
Neutrophils Relative %: 74 %
Platelets: 209 10*3/uL (ref 150–400)
RBC: 5.06 MIL/uL (ref 4.22–5.81)
RDW: 14.5 % (ref 11.5–15.5)
WBC: 13.3 10*3/uL — ABNORMAL HIGH (ref 4.0–10.5)
nRBC: 0 % (ref 0.0–0.2)

## 2019-06-01 LAB — SARS CORONAVIRUS 2 BY RT PCR (HOSPITAL ORDER, PERFORMED IN ~~LOC~~ HOSPITAL LAB): SARS Coronavirus 2: NEGATIVE

## 2019-06-01 LAB — APTT: aPTT: 29 seconds (ref 24–36)

## 2019-06-01 LAB — LACTIC ACID, PLASMA
Lactic Acid, Venous: 2.5 mmol/L (ref 0.5–1.9)
Lactic Acid, Venous: 1.6 mmol/L (ref 0.5–1.9)

## 2019-06-01 MED ORDER — SODIUM CHLORIDE 0.9 % IV BOLUS
1000.0000 mL | Freq: Once | INTRAVENOUS | Status: AC
  Administered 2019-06-01: 1000 mL via INTRAVENOUS

## 2019-06-01 MED ORDER — SODIUM CHLORIDE 0.9 % IV SOLN: 2.0000 g | Freq: Once | INTRAVENOUS | Status: DC

## 2019-06-01 MED ORDER — TRIAMCINOLONE ACETONIDE 0.1 % EX CREA
TOPICAL_CREAM | Freq: Once | CUTANEOUS | Status: AC
  Administered 2019-06-01: 22:00:00 via TOPICAL
  Filled 2019-06-01: qty 15

## 2019-06-01 MED ORDER — SODIUM CHLORIDE 0.9% FLUSH
3.0000 mL | Freq: Once | INTRAVENOUS | Status: AC
  Administered 2019-06-01: 3 mL via INTRAVENOUS

## 2019-06-01 MED ORDER — VANCOMYCIN HCL IN DEXTROSE 1-5 GM/200ML-% IV SOLN: 1000.0000 mg | Freq: Once | INTRAVENOUS | Status: DC

## 2019-06-01 MED ORDER — SILVER SULFADIAZINE 1 % EX CREA
TOPICAL_CREAM | Freq: Once | CUTANEOUS | Status: AC
  Administered 2019-06-01: 22:00:00 via TOPICAL
  Filled 2019-06-01: qty 85

## 2019-06-01 NOTE — ED Triage Notes (Signed)
Pt sent from doctors office for widespread rash, some look like blisters, and other areas look like red and raised, itches.  No new foods, products, or foods.  No respiratory or swallowing difficulties.

## 2019-06-01 NOTE — Consult Note (Signed)
Medical Consultation   Gavin Campbell  QMG:[id number]  DOB: 02/08/1935  DOA: 06/01/2019  PCP: Vernie Shanks, MD    Requesting physician: Dr. Regenia Skeeter  Reason for consultation: Skin rash   History of Present Illness: Gavin Campbell is an 84 y.o. male h/o HTN, non-occlusive CAD, quit smoking 50 years ago.  Patient presents to ED with 2 week history of skin rash.  Rash is generalized, described as some areas of blisters, some areas red and raised.  No new foods, meds, products.  No swallowing nor respiratory difficulties, no oral ulcers, lesions, soars.  Saw PCP and PCP sent to ED.  Rash (see pictures in physical exam section), is worst on abdomen, but is generalized, with bullous lesions on both arms and both legs.   Review of Systems:  ROS As per HPI otherwise 10 point review of systems negative.     Past Medical History: Past Medical History:  Diagnosis Date  . Blurred vision   . Chest pain 2007   ABNORMAL CARDIOLITE   . Coronary arteriosclerosis in native artery   . Diplopia   . Early satiety   . Hyperlipidemia   . Hypertension   . Left ventricular systolic dysfunction   . Orthostatic dizziness   . Parotid mass 2017   Pleomorphic Adenoma by biopsy  . Pure hypercholesterolemia   . Unsteady gait   . Venous stasis dermatitis of right lower extremity     Past Surgical History: Past Surgical History:  Procedure Laterality Date  . CORONARY ANGIOPLASTY  2009   NON OBSTRUCTIVE DISEASE     Allergies:  No Known Allergies   Social History:  reports that he quit smoking about 50 years ago. He has never used smokeless tobacco. He reports that he does not drink alcohol or use drugs.   Family History: Family History  Problem Relation Age of Onset  . Healthy Mother   . Dementia Mother   . Healthy Father      Physical Exam: Vitals:   06/01/19 2015 06/01/19 2030 06/01/19 2045 06/01/19 2045  BP:  137/81 (!) 145/76   Pulse: 99     Resp:       Temp:    98.9 F (37.2 C)  TempSrc:    Rectal  SpO2: 97%       Constitutional: Alert and awake, oriented x3, not in any acute distress. Eyes: PERLA, EOMI, irises appear normal, anicteric sclera,  ENMT: external ears and nose appear normal            Lips appears normal, oropharynx mucosa, tongue, posterior pharynx appear normal  Neck: neck appears normal, no masses, normal ROM, no thyromegaly, no JVD  CVS: S1-S2 clear, no murmur rubs or gallops, no LE edema, normal pedal pulses  Respiratory:  clear to auscultation bilaterally, no wheezing, rales or rhonchi. Respiratory effort normal. No accessory muscle use.  Abdomen: soft nontender, nondistended, normal bowel sounds, no hepatosplenomegaly, no hernias  Musculoskeletal: : no cyanosis, clubbing or edema noted bilaterally Neuro: Cranial nerves II-XII intact, strength, sensation, reflexes Psych: judgement and insight appear normal, stable mood and affect, mental status Skin:      Data reviewed:  I have personally reviewed following labs and imaging studies Labs:  CBC: Recent Labs  Lab 06/01/19 1829  WBC 13.3*  NEUTROABS 9.8*  HGB 14.5  HCT 46.1  MCV 91.1  PLT 326    Basic Metabolic Panel:  Recent Labs  Lab 06/01/19 1829  NA 138  K 3.6  CL 104  CO2 22  GLUCOSE 105*  BUN 24*  CREATININE 1.58*  CALCIUM 9.5   GFR CrCl cannot be calculated (Unknown ideal weight.). Liver Function Tests: Recent Labs  Lab 06/01/19 1829  AST 25  ALT 17  ALKPHOS 41  BILITOT 1.2  PROT 7.1  ALBUMIN 4.3   No results for input(s): LIPASE, AMYLASE in the last 168 hours. No results for input(s): AMMONIA in the last 168 hours. Coagulation profile No results for input(s): INR, PROTIME in the last 168 hours.  Cardiac Enzymes: No results for input(s): CKTOTAL, CKMB, CKMBINDEX, TROPONINI in the last 168 hours. BNP: Invalid input(s): POCBNP CBG: No results for input(s): GLUCAP in the last 168 hours. D-Dimer No results for  input(s): DDIMER in the last 72 hours. Hgb A1c No results for input(s): HGBA1C in the last 72 hours. Lipid Profile No results for input(s): CHOL, HDL, LDLCALC, TRIG, CHOLHDL, LDLDIRECT in the last 72 hours. Thyroid function studies No results for input(s): TSH, T4TOTAL, T3FREE, THYROIDAB in the last 72 hours.  Invalid input(s): FREET3 Anemia work up No results for input(s): VITAMINB12, FOLATE, FERRITIN, TIBC, IRON, RETICCTPCT in the last 72 hours. Urinalysis No results found for: COLORURINE, APPEARANCEUR, LABSPEC, [place], GLUCOSEU, HGBUR, BILIRUBINUR, KETONESUR, PROTEINUR, UROBILINOGEN, NITRITE, [place]   Microbiology No results found for this or any previous visit (from the past 240 hour(s)).     Inpatient Medications:   Scheduled Meds: . sodium chloride flush  3 mL Intravenous Once   Continuous Infusions: . sodium chloride       Radiological Exams on Admission: No results found.  Impression/Recommendations  Bullous pemphigoid vs pemphigus vulgaris vs other (IgA pemphigus, pemphigus foliculitis, paraneoplastic pemphigus) -  Suspect patient probably needs biopsy. Patient would be best served at tertiary care center with dermatology and possibly rheumatology consultation availability (neither of which is available at [facility]). Thus EDP has contacted [facility] for transfer.  I have spoken to patient and wife, they are understanding and agreeable to transfer. If no beds available then we will admit in the mean time and try and order work up as able.   Thank you for this consultation.   Time Spent: 60 min  ,  M. D.O. [facility] 06/01/2019, 9:22 PM

## 2019-06-01 NOTE — ED Notes (Signed)
Patient verbalizes understanding of discharge instructions. Opportunity for questioning and answers were provided. Armband removed by staff, pt discharged from ED in wheelchair.  

## 2019-06-01 NOTE — Discharge Instructions (Signed)
The dermatology group will call you tomorrow for an appointment.  If the rash worsens, he develops a fever, vomiting, or any other new/concerning symptoms then return to the ER for evaluation.

## 2019-06-01 NOTE — ED Notes (Signed)
PT given food and drink to take his regular medications.

## 2019-06-01 NOTE — ED Provider Notes (Signed)
[facility] EMERGENCY DEPARTMENT Provider Note   CSN: [id number] Arrival date & time: 06/01/19  1417   LEVEL 5 CAVEAT - DEMENTIA   History   Chief Complaint Chief Complaint  Patient presents with   Rash    HPI NOLON YELLIN is a 84 y.o. male.     HPI  85 year old male presents with rash.  History is taken from the wife.  The rash has been present for about 2 or 3 weeks.  Perhaps slowly worsening.  Started in his legs and has progressed.  The worst is on his abdomen.  When asked why they came in tonight, the wife states that she needed it to be checked out but is not particularly worse.  No fevers.  No new medicines.  Past Medical History:  Diagnosis Date   Blurred vision    Chest pain 2007   ABNORMAL CARDIOLITE    Coronary arteriosclerosis in native artery    Diplopia    Early satiety    Hyperlipidemia    Hypertension    Left ventricular systolic dysfunction    Orthostatic dizziness    Parotid mass 2017   Pleomorphic Adenoma by biopsy   Pure hypercholesterolemia    Unsteady gait    Venous stasis dermatitis of right lower extremity     Patient Active Problem List   Diagnosis Date Noted   Hyperlipidemia 08/12/2018   Mild dementia ([place]) 05/24/2017   Chest pain 04/03/2017   Seasonal allergic rhinitis 02/27/2016   Hoarseness 02/27/2016   Mass of parotid gland 11/29/2015    Past Surgical History:  Procedure Laterality Date   CORONARY ANGIOPLASTY  2009   NON OBSTRUCTIVE DISEASE        Home Medications    Prior to Admission medications   Medication Sig Start Date End Date Taking? Authorizing Provider  amLODipine (NORVASC) 2.5 MG tablet Take 2.5 mg by mouth daily.    [provider]  aspirin 81 MG tablet Take 81 mg by mouth daily.    [provider]  carbidopa-levodopa (SINEMET IR) 25-100 MG tablet TAKE 1/2 TABLET BY MOUTH THREE TIMES DAILY WITH MEALS 04/08/19   [name], MD  Coenzyme Q10 10 MG  capsule Take 10 mg by mouth daily.    [provider]  desoximetasone (TOPICORT) 0.25 % cream MASSAGE ON RASH D 05/22/17   [provider]  metoprolol succinate (TOPROL-XL) 25 MG 24 hr tablet Take 25 mg by mouth daily.    [provider]  mirtazapine (REMERON) 15 MG tablet TK 1 T PO QPM HS 10/01/18   [provider]  simvastatin (ZOCOR) 20 MG tablet Take 20 mg by mouth daily.    [provider]  TRIAMCINOLONE ACETONIDE EX Apply topically 2 (two) times daily.    [provider]    Family History Family History  Problem Relation Age of Onset   Healthy Mother    Dementia Mother    Healthy Father     Social History Social History   Tobacco Use   Smoking status: Former Smoker    Quit date: 04/03/1969    Years since quitting: 50.1   Smokeless tobacco: Never Used  Substance Use Topics   Alcohol use: No    Alcohol/week: 0.0 standard drinks   Drug use: No     Allergies   Patient has no known allergies.   Review of Systems Review of Systems  Constitutional: Negative for fever.  Skin: Positive for rash.  All  other systems reviewed and are negative.    Physical Exam Updated Vital Signs BP (!) 157/96 (BP Location: Right Arm)    Pulse 100    Temp 98.9 F (37.2 C) (Rectal)    Resp 13    Ht 5' 7.5" (1.715 m)    Wt 69.9 kg    SpO2 100%    BMI 23.76 kg/m   Physical Exam Vitals signs and nursing note reviewed.  Constitutional:      Appearance: He is well-developed.  HENT:     Head: Normocephalic and atraumatic.     Right Ear: External ear normal.     Left Ear: External ear normal.     Nose: Nose normal.  Eyes:     General:        Right eye: No discharge.        Left eye: No discharge.  Neck:     Musculoskeletal: Neck supple.  Cardiovascular:     Rate and Rhythm: Normal rate and regular rhythm.     Heart sounds: Normal heart sounds.  Pulmonary:     Effort: Pulmonary effort is normal.     Breath sounds: Normal  breath sounds.  Abdominal:     Palpations: Abdomen is soft.     Tenderness: There is no abdominal tenderness.  Skin:    General: Skin is warm and dry.     Findings: Rash present.     Comments: See pictures. Diffuse red rash, mostly flat but otherwise has bullae. Some have ruptured. No tenderness. No excessive warmth.  Neurological:     Mental Status: He is alert. He is disoriented.  Psychiatric:        Mood and Affect: Mood is not anxious.          ED Treatments / Results  Labs (all labs ordered are listed, but only abnormal results are displayed) Labs Reviewed  LACTIC ACID, PLASMA - Abnormal; Notable for the following components:      Result Value   Lactic Acid, Venous 2.5 (*)    All other components within normal limits  COMPREHENSIVE METABOLIC PANEL - Abnormal; Notable for the following components:   Glucose, Bld 105 (*)    BUN 24 (*)    Creatinine, Ser 1.58 (*)    GFR calc non Af Amer 40 (*)    GFR calc Af Amer 47 (*)    All other components within normal limits  CBC WITH DIFFERENTIAL/PLATELET - Abnormal; Notable for the following components:   WBC 13.3 (*)    Neutro Abs 9.8 (*)    Eosinophils Absolute 1.5 (*)    All other components within normal limits  SARS CORONAVIRUS 2 (HOSPITAL ORDER, [facility])  CULTURE, BLOOD (ROUTINE X 2)  CULTURE, BLOOD (ROUTINE X 2)  LACTIC ACID, PLASMA  APTT    EKG EKG Interpretation  Date/Time:  Monday June 01 2019 21:13:50 EDT Ventricular Rate:  99 PR Interval:    QRS Duration: 90 QT Interval:  370 QTC Calculation: 475 R Axis:   9 Text Interpretation:  Sinus rhythm LVH with secondary repolarization abnormality Inferolateral infarct, age indeterminate Confirmed by Sherwood Gambler [phone]) on 06/01/2019 9:22:09 PM   Radiology No results found.  Procedures Procedures (including critical care time)  Medications Ordered in ED Medications  sodium chloride flush (NS) 0.9 % injection 3 mL (3 mLs  Intravenous Given 06/01/19 2125)  sodium chloride 0.9 % bolus 1,000 mL (0 mLs Intravenous Stopped 06/01/19 2239)  triamcinolone cream (KENALOG)  0.1 % ( Topical Given 06/01/19 2226)  silver sulfADIAZINE (SILVADENE) 1 % cream ( Topical Given 06/01/19 2226)     Initial Impression / Assessment and Plan / ED Course  I have reviewed the triage vital signs and the nursing notes.  Pertinent labs & imaging results that were available during my care of the patient were reviewed by me and considered in my medical decision making (see chart for details).        Patient does not appear systemically ill.  Rash probably bullous pemphigoid though other pemphigoid differential diagnoses are possible as well.  He had a lactate of 2.5 on arrival though I do not think he is necessarily has an infection.  Given how long this is been going on (2-3 weeks), no fever, I do not think this is infected.  I did discuss with the hospitalist for admission for further work-up but given no dermatology they recommended [facility].  I discussed with Dr. Nathaniel Man at [facility].  He recommends that if the patient appears okay, it will be fastest for him to be seen in the clinic tomorrow and they will contact the wife who states the granddaughter can drive him tomorrow.  [facility] currently does not have any beds for at least the next 24-48 hours.  I do not think just waiting in the ED or even in the hospital with no diagnosis would help him much.  Dr. Nathaniel Man recommends triamcinolone and Silvadene and a 3-1 ratio for relief tonight and they can see him tomorrow.  I think this is reasonable as my suspicion for sepsis or admission is lower with this more information.  Will discharge home with strict return precautions.  Wife is comfortable with plan.  Final Clinical Impressions(s) / ED Diagnoses   Final diagnoses:  Rash and nonspecific skin eruption    ED Discharge Orders    None       Sherwood Gambler, MD 06/01/19 2243

## 2019-06-02 DIAGNOSIS — R21 Rash and other nonspecific skin eruption: Secondary | ICD-10-CM | POA: Diagnosis not present

## 2019-06-02 DIAGNOSIS — Z79899 Other long term (current) drug therapy: Secondary | ICD-10-CM | POA: Diagnosis not present

## 2019-06-02 DIAGNOSIS — Z5181 Encounter for therapeutic drug level monitoring: Secondary | ICD-10-CM | POA: Diagnosis not present

## 2019-06-02 DIAGNOSIS — L139 Bullous disorder, unspecified: Secondary | ICD-10-CM | POA: Diagnosis not present

## 2019-06-04 DIAGNOSIS — Z5181 Encounter for therapeutic drug level monitoring: Secondary | ICD-10-CM | POA: Diagnosis not present

## 2019-06-04 DIAGNOSIS — R531 Weakness: Secondary | ICD-10-CM | POA: Diagnosis not present

## 2019-06-04 DIAGNOSIS — L120 Bullous pemphigoid: Secondary | ICD-10-CM | POA: Diagnosis not present

## 2019-06-04 DIAGNOSIS — F0280 Dementia in other diseases classified elsewhere without behavioral disturbance: Secondary | ICD-10-CM | POA: Diagnosis not present

## 2019-06-04 DIAGNOSIS — Z9181 History of falling: Secondary | ICD-10-CM | POA: Diagnosis not present

## 2019-06-04 DIAGNOSIS — G20 Parkinson's disease: Secondary | ICD-10-CM | POA: Diagnosis not present

## 2019-06-04 DIAGNOSIS — I2510 Atherosclerotic heart disease of native coronary artery without angina pectoris: Secondary | ICD-10-CM | POA: Diagnosis not present

## 2019-06-04 DIAGNOSIS — R400 Somnolence: Secondary | ICD-10-CM | POA: Diagnosis not present

## 2019-06-04 DIAGNOSIS — R5381 Other malaise: Secondary | ICD-10-CM | POA: Diagnosis not present

## 2019-06-05 LAB — CULTURE, BLOOD (ROUTINE X 2)

## 2019-06-06 ENCOUNTER — Telehealth: Payer: Self-pay | Admitting: Emergency Medicine

## 2019-06-06 LAB — CULTURE, BLOOD (ROUTINE X 2): Culture: NO GROWTH

## 2019-06-06 NOTE — Telephone Encounter (Signed)
Post ED Visit - Positive Culture Follow-up  Culture report reviewed by antimicrobial stewardship pharmacist: [place] Team []  Elenor Quinones, Pharm.D. []  Heide Guile, Pharm.D., BCPS AQ-ID []  Parks Neptune, Pharm.D., BCPS []  Alycia Rossetti, Pharm.D., BCPS []  [place], Pharm.D., BCPS, AAHIVP []  Legrand Como, Pharm.D., BCPS, AAHIVP [x]  Salome Arnt, PharmD, BCPS []  Johnnette Gourd, PharmD, BCPS []  Hughes Better, PharmD, BCPS []  Leeroy Cha, PharmD []  Laqueta Linden, PharmD, BCPS []  Albertina Parr, PharmD  [place] Team []  Leodis Sias, PharmD []  Lindell Spar, PharmD []  Royetta Asal, PharmD []  Graylin Shiver, Rph []  Rema Fendt) Glennon Mac, PharmD []  Arlyn Dunning, PharmD []  Netta Cedars, PharmD []  Dia Sitter, PharmD []  Leone Haven, PharmD []  Gretta Arab, PharmD []  Theodis Shove, PharmD []  Peggyann Juba, PharmD []  Reuel Boom, PharmD   Positive blood culture Likely contaminant, no further patient follow-up is required at this time.  [place] 06/06/2019, 2:05 PM

## 2019-06-12 DIAGNOSIS — R0601 Orthopnea: Secondary | ICD-10-CM | POA: Diagnosis not present

## 2019-06-12 DIAGNOSIS — F0280 Dementia in other diseases classified elsewhere without behavioral disturbance: Secondary | ICD-10-CM | POA: Diagnosis not present

## 2019-06-12 DIAGNOSIS — R0989 Other specified symptoms and signs involving the circulatory and respiratory systems: Secondary | ICD-10-CM | POA: Diagnosis not present

## 2019-06-12 DIAGNOSIS — M4012 Other secondary kyphosis, cervical region: Secondary | ICD-10-CM | POA: Diagnosis not present

## 2019-06-12 DIAGNOSIS — R296 Repeated falls: Secondary | ICD-10-CM | POA: Diagnosis not present

## 2019-06-12 DIAGNOSIS — L109 Pemphigus, unspecified: Secondary | ICD-10-CM | POA: Diagnosis not present

## 2019-06-12 DIAGNOSIS — G20 Parkinson's disease: Secondary | ICD-10-CM | POA: Diagnosis not present

## 2019-06-13 DIAGNOSIS — G8929 Other chronic pain: Secondary | ICD-10-CM | POA: Diagnosis not present

## 2019-06-13 DIAGNOSIS — F0390 Unspecified dementia without behavioral disturbance: Secondary | ICD-10-CM | POA: Diagnosis not present

## 2019-06-13 DIAGNOSIS — G20 Parkinson's disease: Secondary | ICD-10-CM | POA: Diagnosis not present

## 2019-06-16 ENCOUNTER — Telehealth: Payer: Self-pay | Admitting: Adult Health Nurse Practitioner

## 2019-06-16 DIAGNOSIS — Z5181 Encounter for therapeutic drug level monitoring: Secondary | ICD-10-CM | POA: Diagnosis not present

## 2019-06-16 DIAGNOSIS — Z79899 Other long term (current) drug therapy: Secondary | ICD-10-CM | POA: Diagnosis not present

## 2019-06-16 DIAGNOSIS — L120 Bullous pemphigoid: Secondary | ICD-10-CM | POA: Diagnosis not present

## 2019-06-16 DIAGNOSIS — Z7952 Long term (current) use of systemic steroids: Secondary | ICD-10-CM | POA: Diagnosis not present

## 2019-06-16 NOTE — Telephone Encounter (Signed)
Spoke with daughter Verita Lamb regarding Palliative services and she was in agreement with this.  I have scheduled a [place] for 06/29/19 @ 4:30 PM

## 2019-06-29 ENCOUNTER — Other Ambulatory Visit: Payer: Medicare PPO | Admitting: Internal Medicine

## 2019-06-29 ENCOUNTER — Other Ambulatory Visit: Payer: Self-pay

## 2019-06-29 ENCOUNTER — Other Ambulatory Visit: Payer: Medicare PPO | Admitting: Hospice

## 2019-06-29 DIAGNOSIS — Z515 Encounter for palliative care: Secondary | ICD-10-CM

## 2019-06-29 NOTE — Progress Notes (Signed)
Sept 14th, 2020 [place] Consult Note Telephone: [phone]  Fax: [phone]  Due to the current COVID-19 infection/crises, the family prefer, and have given their verbal consent for, a provider visit via telemedicine. HIPPA policies of confidentially were discussed. Video-audio (telehealth) contact was unable to be done due technical barriers from the patient's side. *this was a goals of care meeting with daughter, from her home  PATIENT NAME: Gavin Campbell DOB: 02-20-1935 MRN: [id number]  PRIMARY CARE PROVIDER:   Vernie Shanks, MD Care Team: Dr. Pearson Grippe (Dermatology) Dr. Ellouise Newer (Neurology) Cassell Clement 04/08/2019)  REFERRING PROVIDER:  Vernie Shanks, MD [place],  [place] [id number]  RESPONSIBLE PARTY: (daughter) Verita Lamb. [phone], [email or web address]. (cousin) Lou Miner [phone]*call Chrys Racer to schedule home visit, then call Beckie Busing to update.   ASSESSMENT / RECOMMENDATIONS:  1. Advance Care Planning:  A. Directives: Daughter Verita Lamb wishes [place] to defer discussions regarding DNR at this this. She is concerned that in patient's frustration to have lost so much of his function over a relatively short period of time, he might be swayed to "give up". [name] wishes to wait to see if he will improve towards his baseline before discussing code status.  B. Goals of Care: Per daughter Beckie Busing, to increase paitent's strength and independence; hopefully to return more to his baseline as treatment for his Parkinson's disease treatment has been iniitated, and as he recovers from flare of bullous pemphigoid.  2. Cognitive / Functional status: Up until late last year, patient was working as a Art gallery manager. Decline over this last year which daughter attributes to the malaise r/t bullous pemphigoid, and onset of symptoms r/t  Parkinson's dz. Currently patient with symptoms mild to  moderate dementia. He is unable to cognate well enough to drive. He speech is a little slurred but clear. Daughter reports he is conversant and on-topic; he recognizes his family members. Increase somnolence such that he is awake about 8hr/day. He needs 1-2 person assist to stand and stand by assist when ambulating with walker. Frequent falls; last about a week ago. He needs assist with dressing and personal hygiene. One recent past fall resulted in a R shoulder injury. Daughter reports improvement in movements with initiation of Sinemet. Patient is aware of when he needs to use the bathroom but is mostly incontinent of bladder and bowel. He has a bed side commode and hospital bed. Progressive decline in appetite with decreased oral consumption of bites for each meal. Last weight (06/16/2019) was 159 lbs. At a height of 5'11" his BMI is 22.18kg/m2. Daughter guesses his weight 9 months earlier was 180lbs. Patient's skin blisters and rash are markedly improved on topical skin creams and use of oral steroids and MTX.   3. Family Supports: Patient has been married for greater than 60 years; lives in his home with his spouse (who is very hard of hearing).  He has 2 sons and 2 daughters. Daughter Verita Lamb liver in [place] 20 mins away and sees her parents on an almost daily basis. Daughter Hulen Shouts lives in [place]; also about 20 min away. [name]'s son is enrolled in [employer] program. He assists patient with his showers. Patient's niece Chrys Racer is retired from nursing; she cares for patient during the day.   4. Follow up Palliative Care Visit: Call niece Lou Miner [phone]) to schedule in home visit with patient. After visit, call (daughter) Verita Lamb. [phone] to update.  I spent 60 minutes providing this consultation from 4:30pm to 5:30pm. More than 50% of the time in this consultation was spent coordinating communication.   HISTORY OF PRESENT ILLNESS:  Gavin Campbell is  an 84 y.o. male with h/o bullous pemphigoid (Dr. Pearson Grippe), dementia (mild to moderate without behavioral disturbances), parkinsonism, CAD, HTN, HLD, bradycardia, parotid mass (pleomorphic adenoma by biopsy), venous stasis dermatitis RLE. ER visit 05/25/2019 for rash; diagnosed with bullous pemphigoid. Referred for outpatient dermatology evaluation/successful treatment. Palliative Care was asked to help address goals of care.   CODE STATUS: not addressed per daughter preference.  PPS: 30%  HOSPICE ELIGIBILITY/DIAGNOSIS: TBD  PAST MEDICAL HISTORY:  Past Medical History:  Diagnosis Date  . Blurred vision   . Chest pain 2007   ABNORMAL CARDIOLITE   . Coronary arteriosclerosis in native artery   . Diplopia   . Early satiety   . Hyperlipidemia   . Hypertension   . Left ventricular systolic dysfunction   . Orthostatic dizziness   . Parotid mass 2017   Pleomorphic Adenoma by biopsy  . Pure hypercholesterolemia   . Unsteady gait   . Venous stasis dermatitis of right lower extremity     SOCIAL HX:  Social History   Tobacco Use  . Smoking status: Former Smoker    Quit date: 04/03/1969    Years since quitting: 50.2  . Smokeless tobacco: Never Used  Substance Use Topics  . Alcohol use: No    Alcohol/week: 0.0 standard drinks    ALLERGIES: No Known Allergies   PERTINENT MEDICATIONS:  Outpatient Encounter Medications as of 06/29/2019  Medication Sig  . amLODipine (NORVASC) 2.5 MG tablet Take 2.5 mg by mouth daily.  Marland Kitchen aspirin 81 MG tablet Take 81 mg by mouth daily.  . carbidopa-levodopa (SINEMET IR) 25-100 MG tablet TAKE 1/2 TABLET BY MOUTH THREE TIMES DAILY WITH MEALS  . desoximetasone (TOPICORT) 0.25 % cream MASSAGE ON RASH D  . folic acid (FOLVITE) 1 MG tablet Take 1 mg by mouth daily.  . methotrexate (RHEUMATREX) 5 MG tablet Take 5 mg by mouth once a week. Caution: Chemotherapy. Protect from light.  . metoprolol succinate (TOPROL-XL) 25 MG 24 hr tablet Take 25 mg by  mouth daily.  . mirtazapine (REMERON) 15 MG tablet TK 1 T PO QPM HS  . predniSONE (DELTASONE) 20 MG tablet Take 20 mg by mouth daily with breakfast. Prednisone 20 mg PO daily: split dose to 15 mg PO upon waking up and 5 mg PO 3 hours later  . simvastatin (ZOCOR) 20 MG tablet Take 20 mg by mouth daily.  . TRIAMCINOLONE ACETONIDE EX Apply topically 2 (two) times daily.  . Coenzyme Q10 10 MG capsule Take 10 mg by mouth daily.   No facility-administered encounter medications on file as of 06/29/2019.     PHYSICAL EXAM:   PE deferred d/t audio only (with patient's daughter) nature of telehealth visit.  Julianne Handler, NP

## 2019-06-30 ENCOUNTER — Telehealth: Payer: Self-pay | Admitting: Hospice

## 2019-06-30 ENCOUNTER — Encounter: Payer: Self-pay | Admitting: Internal Medicine

## 2019-06-30 NOTE — Telephone Encounter (Signed)
Called patient's niece Lou Miner to schedule an In-person Palliative f/u visit, this was scheduled for 07/14/19 @ 11 AM.

## 2019-07-01 ENCOUNTER — Emergency Department (HOSPITAL_COMMUNITY)
Admission: EM | Admit: 2019-07-01 | Discharge: 2019-07-01 | Disposition: A | Discharge status: Home / Self Care | Payer: Medicare PPO | Attending: Emergency Medicine | Admitting: Emergency Medicine

## 2019-07-01 ENCOUNTER — Encounter (HOSPITAL_COMMUNITY): Payer: Self-pay | Admitting: Emergency Medicine

## 2019-07-01 ENCOUNTER — Other Ambulatory Visit: Payer: Self-pay

## 2019-07-01 ENCOUNTER — Emergency Department (HOSPITAL_COMMUNITY): Payer: Medicare PPO

## 2019-07-01 DIAGNOSIS — G20 Parkinson's disease: Secondary | ICD-10-CM | POA: Insufficient documentation

## 2019-07-01 DIAGNOSIS — F418 Other specified anxiety disorders: Secondary | ICD-10-CM | POA: Diagnosis not present

## 2019-07-01 DIAGNOSIS — R918 Other nonspecific abnormal finding of lung field: Secondary | ICD-10-CM | POA: Diagnosis not present

## 2019-07-01 DIAGNOSIS — F0390 Unspecified dementia without behavioral disturbance: Secondary | ICD-10-CM | POA: Insufficient documentation

## 2019-07-01 DIAGNOSIS — R531 Weakness: Secondary | ICD-10-CM | POA: Insufficient documentation

## 2019-07-01 DIAGNOSIS — Z79899 Other long term (current) drug therapy: Secondary | ICD-10-CM | POA: Diagnosis not present

## 2019-07-01 DIAGNOSIS — E785 Hyperlipidemia, unspecified: Secondary | ICD-10-CM | POA: Diagnosis not present

## 2019-07-01 DIAGNOSIS — I739 Peripheral vascular disease, unspecified: Secondary | ICD-10-CM | POA: Diagnosis not present

## 2019-07-01 DIAGNOSIS — Z7982 Long term (current) use of aspirin: Secondary | ICD-10-CM | POA: Diagnosis not present

## 2019-07-01 DIAGNOSIS — F1021 Alcohol dependence, in remission: Secondary | ICD-10-CM | POA: Diagnosis not present

## 2019-07-01 DIAGNOSIS — F4321 Adjustment disorder with depressed mood: Secondary | ICD-10-CM | POA: Diagnosis not present

## 2019-07-01 DIAGNOSIS — R0602 Shortness of breath: Secondary | ICD-10-CM | POA: Insufficient documentation

## 2019-07-01 DIAGNOSIS — Z87891 Personal history of nicotine dependence: Secondary | ICD-10-CM | POA: Diagnosis not present

## 2019-07-01 DIAGNOSIS — I10 Essential (primary) hypertension: Secondary | ICD-10-CM | POA: Diagnosis not present

## 2019-07-01 DIAGNOSIS — B351 Tinea unguium: Secondary | ICD-10-CM | POA: Diagnosis not present

## 2019-07-01 DIAGNOSIS — F331 Major depressive disorder, recurrent, moderate: Secondary | ICD-10-CM | POA: Diagnosis not present

## 2019-07-01 DIAGNOSIS — R0902 Hypoxemia: Secondary | ICD-10-CM | POA: Diagnosis not present

## 2019-07-01 DIAGNOSIS — R404 Transient alteration of awareness: Secondary | ICD-10-CM | POA: Diagnosis not present

## 2019-07-01 HISTORY — DX: Parkinson's disease: G20

## 2019-07-01 HISTORY — DX: Parkinson's disease without dyskinesia, without mention of fluctuations: G20.A1

## 2019-07-01 LAB — CBC WITH DIFFERENTIAL/PLATELET
Abs Immature Granulocytes: 0.16 10*3/uL — ABNORMAL HIGH (ref 0.00–0.07)
Basophils Absolute: 0 10*3/uL (ref 0.0–0.1)
Basophils Relative: 0 %
Eosinophils Absolute: 1.4 10*3/uL — ABNORMAL HIGH (ref 0.0–0.5)
Eosinophils Relative: 10 %
HCT: 43.3 % (ref 39.0–52.0)
Hemoglobin: 14.4 g/dL (ref 13.0–17.0)
Immature Granulocytes: 1 %
Lymphocytes Relative: 7 %
Lymphs Abs: 1 10*3/uL (ref 0.7–4.0)
MCH: 29.6 pg (ref 26.0–34.0)
MCHC: 33.3 g/dL (ref 30.0–36.0)
MCV: 88.9 fL (ref 80.0–100.0)
Monocytes Absolute: 0.4 10*3/uL (ref 0.1–1.0)
Monocytes Relative: 3 %
Neutro Abs: 11.5 10*3/uL — ABNORMAL HIGH (ref 1.7–7.7)
Neutrophils Relative %: 79 %
Platelets: 160 10*3/uL (ref 150–400)
RBC: 4.87 MIL/uL (ref 4.22–5.81)
RDW: 14.9 % (ref 11.5–15.5)
WBC: 14.5 10*3/uL — ABNORMAL HIGH (ref 4.0–10.5)
nRBC: 0 % (ref 0.0–0.2)

## 2019-07-01 LAB — URINALYSIS, ROUTINE W REFLEX MICROSCOPIC
Bilirubin Urine: NEGATIVE
Glucose, UA: NEGATIVE mg/dL
Hgb urine dipstick: NEGATIVE
Ketones, ur: 5 mg/dL — AB
Leukocytes,Ua: NEGATIVE
Nitrite: NEGATIVE
Protein, ur: 30 mg/dL — AB
Specific Gravity, Urine: 1.026 (ref 1.005–1.030)
pH: 5 (ref 5.0–8.0)

## 2019-07-01 LAB — COMPREHENSIVE METABOLIC PANEL
ALT: 10 U/L (ref 0–44)
AST: 13 U/L — ABNORMAL LOW (ref 15–41)
Albumin: 2.5 g/dL — ABNORMAL LOW (ref 3.5–5.0)
Alkaline Phosphatase: 50 U/L (ref 38–126)
Anion gap: 10 (ref 5–15)
BUN: 14 mg/dL (ref 8–23)
CO2: 20 mmol/L — ABNORMAL LOW (ref 22–32)
Calcium: 8.5 mg/dL — ABNORMAL LOW (ref 8.9–10.3)
Chloride: 106 mmol/L (ref 98–111)
Creatinine, Ser: 1.11 mg/dL (ref 0.61–1.24)
GFR calc Af Amer: >60 mL/min (ref >60)
GFR calc non Af Amer: >60 mL/min (ref >60)
Glucose, Bld: 107 mg/dL — ABNORMAL HIGH (ref 70–99)
Potassium: 4 mmol/L (ref 3.5–5.1)
Sodium: 136 mmol/L (ref 135–145)
Total Bilirubin: 1.1 mg/dL (ref 0.3–1.2)
Total Protein: 5.9 g/dL — ABNORMAL LOW (ref 6.5–8.1)

## 2019-07-01 LAB — CBG MONITORING, ED: Glucose-Capillary: 74 mg/dL (ref 70–99)

## 2019-07-01 NOTE — ED Provider Notes (Signed)
[facility] EMERGENCY DEPARTMENT Provider Note   CSN: [id number] Arrival date & time: 07/01/19  1324     History   Chief Complaint Chief Complaint  Patient presents with  . Weakness  . Shortness of Breath    HPI Gavin Campbell is a 84 y.o. male past medical 3 of Parkinson's, hyperlipidemia, hypertension, dementia, brought in by EMS for evaluation of generalized weakness.  Per EMS, patient has a home health care worker who noted that patient was having some more difficulty ambulating.  Healthcare worker states that patient does have some difficulty ambulating and sometimes requires minimal assistance but seems today that he was requiring more assistance.  Additionally, his O2 sats were between 88-91%.  EMS states that on their arrival, they went up to 99%.  Patient currently denies any complaints.  He states he does not have a chest pain, abdominal pain, difficulty breathing, numbness/weakness of his arms or legs.  Patient denies any fevers.      The history is provided by the EMS personnel and the patient.    Past Medical History:  Diagnosis Date  . Blurred vision   . Chest pain 2007   ABNORMAL CARDIOLITE   . Coronary arteriosclerosis in native artery   . Diplopia   . Early satiety   . Hyperlipidemia   . Hypertension   . Left ventricular systolic dysfunction   . Orthostatic dizziness   . Parotid mass 2017   Pleomorphic Adenoma by biopsy  . Pure hypercholesterolemia   . Unsteady gait   . Venous stasis dermatitis of right lower extremity     Patient Active Problem List   Diagnosis Date Noted  . Hyperlipidemia 08/12/2018  . Mild dementia ([place]) 05/24/2017  . Chest pain 04/03/2017  . Seasonal allergic rhinitis 02/27/2016  . Hoarseness 02/27/2016  . Mass of parotid gland 11/29/2015    Past Surgical History:  Procedure Laterality Date  . CORONARY ANGIOPLASTY  2009   NON OBSTRUCTIVE DISEASE        Home Medications    Prior to Admission  medications   Medication Sig Start Date End Date Taking? Authorizing Provider  amLODipine (NORVASC) 2.5 MG tablet Take 2.5 mg by mouth daily.    [provider]  aspirin 81 MG tablet Take 81 mg by mouth daily.    [provider]  carbidopa-levodopa (SINEMET IR) 25-100 MG tablet TAKE 1/2 TABLET BY MOUTH THREE TIMES DAILY WITH MEALS 04/08/19   [name], MD  Coenzyme Q10 10 MG capsule Take 10 mg by mouth daily.    [provider]  desoximetasone (TOPICORT) 0.25 % cream MASSAGE ON RASH D 05/22/17   [provider]  folic acid (FOLVITE) 1 MG tablet Take 1 mg by mouth daily.    [provider]  methotrexate (RHEUMATREX) 5 MG tablet Take 5 mg by mouth once a week. Caution: Chemotherapy. Protect from light.    [provider]  metoprolol succinate (TOPROL-XL) 25 MG 24 hr tablet Take 25 mg by mouth daily.    [provider]  mirtazapine (REMERON) 15 MG tablet TK 1 T PO QPM HS 10/01/18   [provider]  predniSONE (DELTASONE) 20 MG tablet Take 20 mg by mouth daily with breakfast. Prednisone 20 mg PO daily: split dose to 15 mg PO upon waking up and 5 mg PO 3 hours later    [provider]  simvastatin (ZOCOR) 20 MG tablet Take 20 mg by mouth daily.  [provider]  TRIAMCINOLONE ACETONIDE EX Apply topically 2 (two) times daily.    [provider]    Family History Family History  Problem Relation Age of Onset  . Healthy Mother   . Dementia Mother   . Healthy Father     Social History Social History   Tobacco Use  . Smoking status: Former Smoker    Quit date: 04/03/1969    Years since quitting: 50.2  . Smokeless tobacco: Never Used  Substance Use Topics  . Alcohol use: No    Alcohol/week: 0.0 standard drinks  . Drug use: No     Allergies   Patient has no known allergies.   Review of Systems Review of Systems  Constitutional: Negative for fever.  Respiratory: Negative for cough  and shortness of breath.   Cardiovascular: Negative for chest pain.  Gastrointestinal: Negative for abdominal pain, nausea and vomiting.  Genitourinary: Negative for dysuria and hematuria.  Neurological: Positive for weakness (generalized). Negative for headaches.  All other systems reviewed and are negative.    Physical Exam Updated Vital Signs BP 119/71 (BP Location: Right Arm)   Pulse 81   Temp 98.8 F (37.1 C) (Oral)   Resp 14   Ht 5' 7.5" (1.715 m)   Wt 69.9 kg   SpO2 98%   BMI 23.76 kg/m   Physical Exam Vitals signs and nursing note reviewed.  Constitutional:      Appearance: Normal appearance. He is well-developed.  HENT:     Head: Normocephalic and atraumatic.  Eyes:     General: Lids are normal.     Conjunctiva/sclera: Conjunctivae normal.     Pupils: Pupils are equal, round, and reactive to light.     Comments: PERRL.  EOMs intact.  He does not show any signs of true neglect.  He does have to be redirected to continue looking to lateral side but is able to complete full range of motion.  Neck:     Musculoskeletal: Full passive range of motion without pain.  Cardiovascular:     Rate and Rhythm: Normal rate and regular rhythm.     Pulses: Normal pulses.          Radial pulses are 2+ on the right side and 2+ on the left side.       Dorsalis pedis pulses are 2+ on the right side and 2+ on the left side.     Heart sounds: Normal heart sounds. No murmur. No friction rub. No gallop.   Pulmonary:     Effort: Pulmonary effort is normal.     Breath sounds: Normal breath sounds.     Comments: Lungs clear to auscultation bilaterally.  Symmetric chest rise.  No wheezing, rales, rhonchi.  No evidence of respiratory distress. Abdominal:     Palpations: Abdomen is soft. Abdomen is not rigid.     Tenderness: There is no abdominal tenderness. There is no guarding.     Comments: Abdomen is soft, non-distended, non-tender. No rigidity, No guarding. No peritoneal signs.   Musculoskeletal: Normal range of motion.  Skin:    General: Skin is warm and dry.     Capillary Refill: Capillary refill takes less than 2 seconds.  Neurological:     Mental Status: He is alert.     Comments: Alert and oriented x2.  He can tell me his name and where he sat.  When asked the year, he says 2022. Cranial nerves III-XII intact Follows commands, Moves all extremities  5/5  strength to BUE and BLE  Sensation intact throughout all major nerve distributions Normal coordination No pronator drift. No slurred speech. No facial droop.   Psychiatric:        Speech: Speech normal.      ED Treatments / Results  Labs (all labs ordered are listed, but only abnormal results are displayed) Labs Reviewed  CBC WITH DIFFERENTIAL/PLATELET  URINALYSIS, ROUTINE W REFLEX MICROSCOPIC  COMPREHENSIVE METABOLIC PANEL  CBG MONITORING, ED    EKG None  Radiology Dg Chest 2 View  Result Date: 07/01/2019 CLINICAL DATA:  Pt here from home where home health nurse said his sats Where 88 -91 % on room EMS states that he got 99 % , RN said that pt may be weaker than normal EXAM: CHEST - 2 VIEW COMPARISON:  01/04/2010 FINDINGS: The heart size and mediastinal contours are within normal limits. Both lungs are clear. The visualized skeletal structures are unremarkable. IMPRESSION: No active cardiopulmonary disease. Electronically Signed   By: Nolon Nations M.D.   On: 07/01/2019 14:10    Procedures Procedures (including critical care time)  Medications Ordered in ED Medications - No data to display   Initial Impression / Assessment and Plan / ED Course  I have reviewed the triage vital signs and the nursing notes.  Pertinent labs & imaging results that were available during my care of the patient were reviewed by me and considered in my medical decision making (see chart for details).        84 year old male with possible history of dementia, Parkinson's, hyperlipidemia, hypertension  brought in by EMS for evaluation of generalized weakness.  Home health nurse felt like he had a little bit more assistance that needed to be required with walking.  Also noted his O2 sats to drop down to 88 but on EMS arrival, they were 99%.  On initial ED arrival, patient has no complaints.  Denies any chest pain, abdominal pain. Patient is afebrile, non-toxic appearing, sitting comfortably on examination table. Vital signs reviewed and stable.  On exam, he is alert and oriented x2.  He follows commands and moves all extremities.  Lungs clear to auscultation.  We will plan to check basic labs, urine, chest x-ray.  Consider infectious source versus electrolyte imbalance.  Low suspicion for ACS etiology.  History/physical exam not concerning for CVA.  Yolanda Bonine is at bedside who states that patient is at his baseline. Patient is requesting to go home. Will check basic labs, UA and CXR.  Discussed patient with Dr. Stark Jock who is agreeable with plan.   Patient signed out to Dr. Koleen Distance with labs pending.   Portions of this note were generated with Lobbyist. Dictation errors may occur despite best attempts at proofreading.    Final Clinical Impressions(s) / ED Diagnoses   Final diagnoses:  Generalized weakness    ED Discharge Orders    None       Volanda Napoleon, PA-C 07/01/19 1544    Veryl Speak, MD 07/06/19 1504

## 2019-07-01 NOTE — Discharge Instructions (Signed)
Please follow up with your primary care doctor.   Return to the Emergency Department for any fever, chest pain, difficulty breathing, abdominal pain, vomiting or any other worsening ro concerning symptoms.

## 2019-07-01 NOTE — ED Notes (Signed)
2 attempts at IV placement without success

## 2019-07-01 NOTE — ED Triage Notes (Signed)
Pt here from home where home health nurse said his sats  Where 88 -91 % on room EMS states that he got 99 % , RN said that pt may be weaker than normal

## 2019-07-01 NOTE — ED Notes (Signed)
Pt verbalized understanding of discharge instructions and S/S requiring return to ED

## 2019-07-01 NOTE — ED Provider Notes (Signed)
Care assumed from [place] at approximately 3:30 PM, please see her note for further details.  Patient presented to the ED with concerns from his home health nurse that his SPO2 was lower than normal.  Patient has been stating well on room air without any respiratory complaints while in the emergency department.  Patient is at his neurologic baseline per grandson at bedside.  Chest x-ray showed no active cardiopulmonary disease.  Plan to follow-up laboratory studies.  Laboratory studies notable for mild leukocytosis which patient appeared to have 1 month ago as well.  UA unrevealing.  CMP unrevealing.  Patient remains in asymptomatic without complaints.  All questions answered and strict return cautions given.  Patient and family comfortable with plan to discharge home and follow-up outpatient.   Patient plan discussed with Dr. Lita Mains.   Betsey Amen, MD 07/02/19 Quentin Mulling    Julianne Rice, MD 07/02/19 1248

## 2019-07-09 DIAGNOSIS — R630 Anorexia: Secondary | ICD-10-CM | POA: Diagnosis not present

## 2019-07-09 DIAGNOSIS — R296 Repeated falls: Secondary | ICD-10-CM | POA: Diagnosis not present

## 2019-07-09 DIAGNOSIS — L109 Pemphigus, unspecified: Secondary | ICD-10-CM | POA: Diagnosis not present

## 2019-07-09 DIAGNOSIS — M4012 Other secondary kyphosis, cervical region: Secondary | ICD-10-CM | POA: Diagnosis not present

## 2019-07-09 DIAGNOSIS — F0280 Dementia in other diseases classified elsewhere without behavioral disturbance: Secondary | ICD-10-CM | POA: Diagnosis not present

## 2019-07-09 DIAGNOSIS — G20 Parkinson's disease: Secondary | ICD-10-CM | POA: Diagnosis not present

## 2019-07-14 ENCOUNTER — Other Ambulatory Visit: Payer: Self-pay

## 2019-07-14 ENCOUNTER — Other Ambulatory Visit: Payer: Medicare PPO | Admitting: Hospice

## 2019-07-14 DIAGNOSIS — Z515 Encounter for palliative care: Secondary | ICD-10-CM

## 2019-07-14 DIAGNOSIS — G8929 Other chronic pain: Secondary | ICD-10-CM | POA: Diagnosis not present

## 2019-07-14 DIAGNOSIS — G20 Parkinson's disease: Secondary | ICD-10-CM | POA: Diagnosis not present

## 2019-07-14 DIAGNOSIS — F0390 Unspecified dementia without behavioral disturbance: Secondary | ICD-10-CM | POA: Diagnosis not present

## 2019-07-14 NOTE — Progress Notes (Signed)
    [facility] Consult Note Telephone: [phone]  Fax: [phone]  PATIENT NAME: Gavin Campbell DOB: September 04, 1935 MRN: [id number]  PRIMARY CARE PROVIDER:   Vernie Shanks, MD  REFERRING PROVIDER:  Vernie Shanks, MD [place],  [place] [address]  RESPONSIBLE PARTY: (daughter) Verita Lamb. [phone], [email or web address]. (cousin) Lou Miner [phone]*call Chrys Racer to schedule home visit, then call Beckie Busing to update.  RECOMMENDATIONS:   Advance Care Planning/Goals of Care: Visit consisted of discussions on Palliative Medicine as specialized medical care for people living with serious illness, aimed at facilitating advance care plan, symptoms relief and establishing goals of care. After several questions on patient's current status, implications of DNR  [name] verbalized understanding and satisfaction. Beckie Busing took the decision that patient should become DNR with goals to  maximize quality of life and symptom management. Marland KitchenMOST form also signed today, with [name] consenting over the phone.  Spouse and Carolyne were present during the in person visit. [name] selected comfort care, no tube feeds, no IV fluids and antibiotics if indicated. DNR and MOST will be uploaded to Epic. [name] requested Hospice.   HISTORY OF PRESENT ILLNESS:  Gavin Campbell is an 84 y.o. male with h/o bullous pemphigoid, parkinsonism, CAD, HTN, HLD, bradycardia, parotid mass (pleomorphic adenoma by biopsy), venous stasis dermatitis RLE. Palliative Care was asked to help address goals of care.    PHYSICAL EXAM/ROS:   Patient is awake, very weak, not able to speak or obey simple commands; a significant decline from 2 weeks ago.  He is now PPS 20$, lethargic most of the time and taking only sips/bites, per family. He appeared comfortable during visit, no guarding when touched; does not appear to be in acute distress.  I spent 80 minutes  providing this consultation, from 11.00am to 12.20pm  More than 50% of the time in this consultation was spent on coordinating advance care communication  CODE STATUS: DNR .  PPS: 20% HOSPICE ELIGIBILITY/DIAGNOSIS: TBD  PAST MEDICAL HISTORY:  Past Medical History:  Diagnosis Date  . Blurred vision   . Chest pain 2007   ABNORMAL CARDIOLITE   . Coronary arteriosclerosis in native artery   . Diplopia   . Early satiety   . Hyperlipidemia   . Hypertension   . Left ventricular systolic dysfunction   . Orthostatic dizziness   . Parkinson's disease ([place])   . Parotid mass 2017   Pleomorphic Adenoma by biopsy  . Pure hypercholesterolemia   . Unsteady gait   . Venous stasis dermatitis of right lower extremity     SOCIAL HX:  Social History   Tobacco Use  . Smoking status: Former Smoker    Quit date: 04/03/1969    Years since quitting: 50.3  . Smokeless tobacco: Never Used  Substance Use Topics  . Alcohol use: No    Alcohol/week: 0.0 standard drinks    ALLERGIES: No Known Allergies     Teodoro Spray, NP

## 2019-07-23 ENCOUNTER — Ambulatory Visit: Payer: Medicare PPO | Admitting: Physical Therapy

## 2019-07-23 ENCOUNTER — Ambulatory Visit: Payer: Medicare PPO | Admitting: Occupational Therapy

## 2019-08-16 DEATH — deceased

## 2019-11-16 ENCOUNTER — Ambulatory Visit: Payer: Medicare PPO | Admitting: Neurology

## 2020-09-14 IMAGING — MR MR CERVICAL SPINE W/O CM
4 of 5 series · 28 of 48 positions shown · non-contrast
Comparison: Cervical radiography 05/15/2018

CLINICAL DATA: Neck pain radiating down the right arm to the
shoulder for several months

EXAM:
MRI CERVICAL SPINE WITHOUT CONTRAST
TECHNIQUE: Multiplanar, multisequence MR imaging of the cervical spine was
performed. No intravenous contrast was administered.

[Series 3: STIR · sagittal · 3.0mm · 0.82mm/px · 6 of 15 slices shown]
[im 1/15]
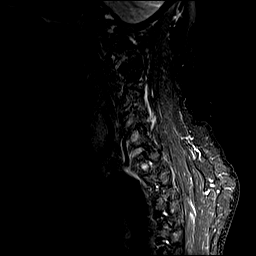
[im 3/15]
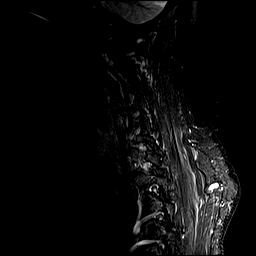
[im 6/15]
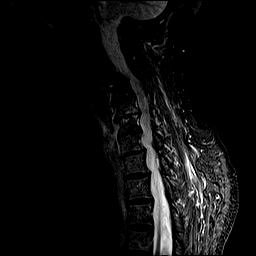
[im 9/15]
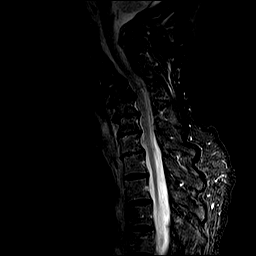
[im 12/15]
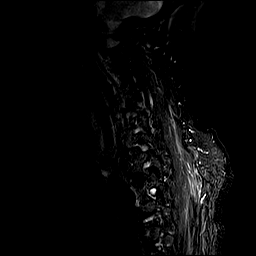
[im 15/15]
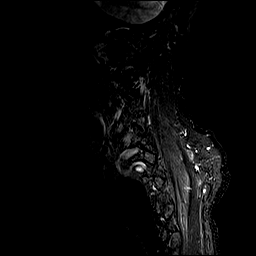

[Series 4: T2 · sagittal · 3.0mm · 0.41mm/px · 7 of 15 slices shown (1 of 2)]
[im 1/15]
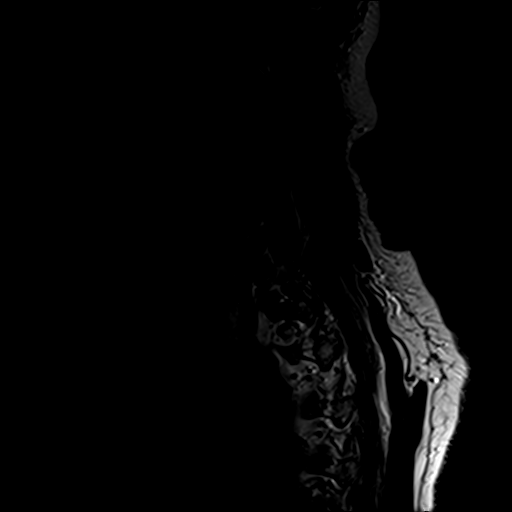
[im 3/15]
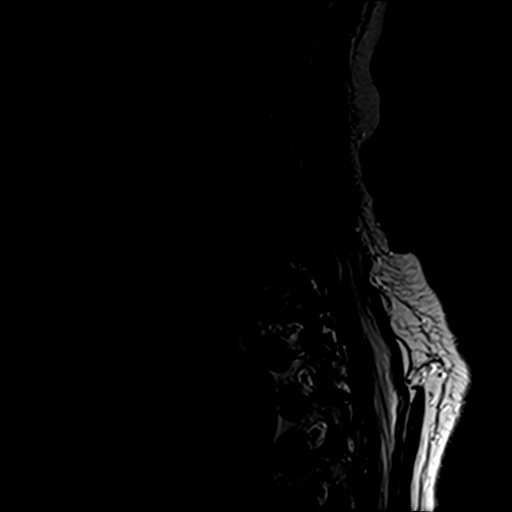
[im 5/15]
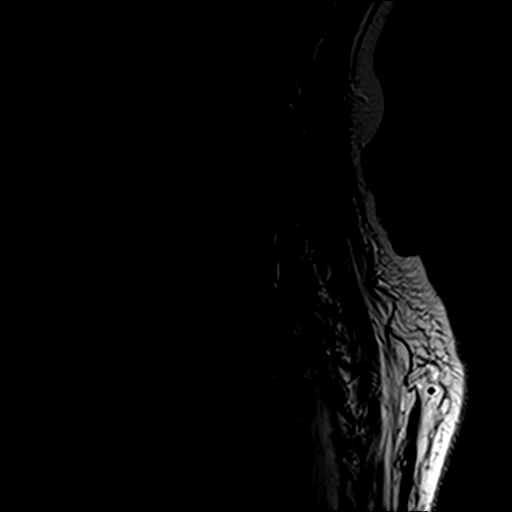
[im 8/15]
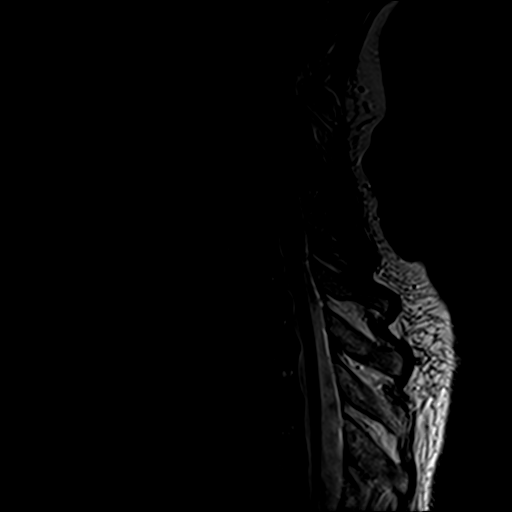
[im 10/15]
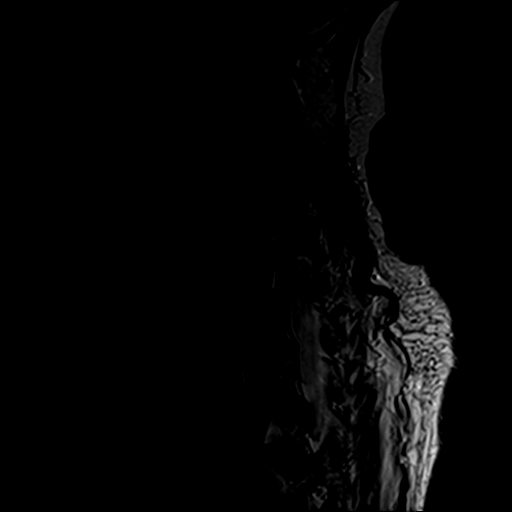
[im 12/15]
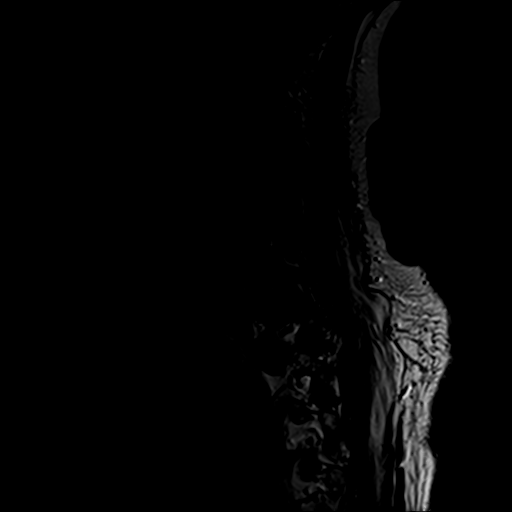
[im 15/15]
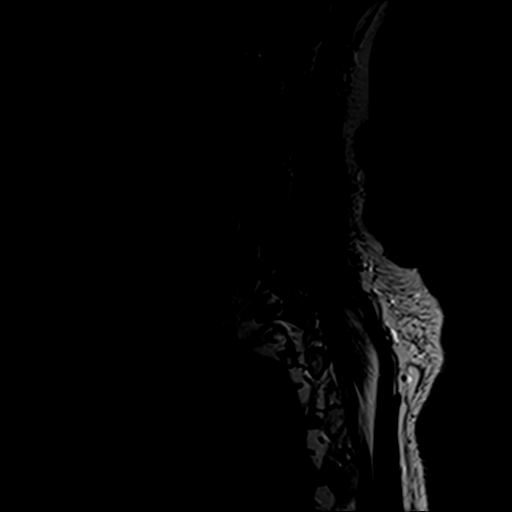

[Series 5: T1 · sagittal · 3.0mm · 0.41mm/px · 7 of 15 slices shown]
[im 1/15]
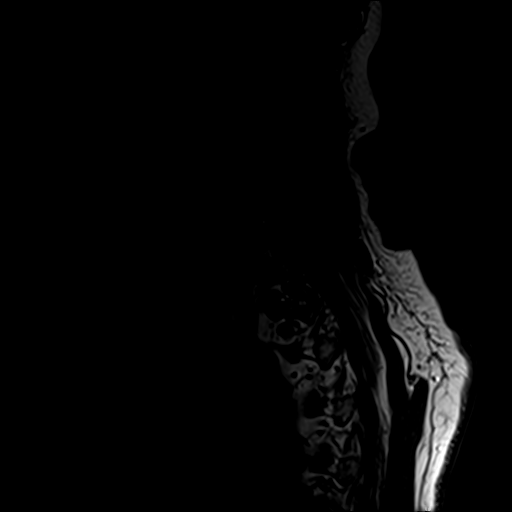
[im 3/15]
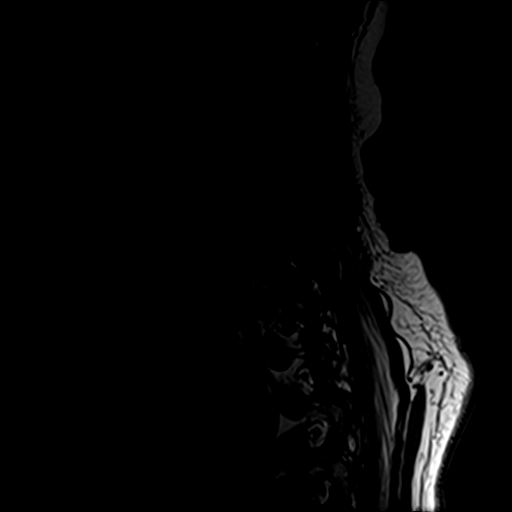
[im 5/15]
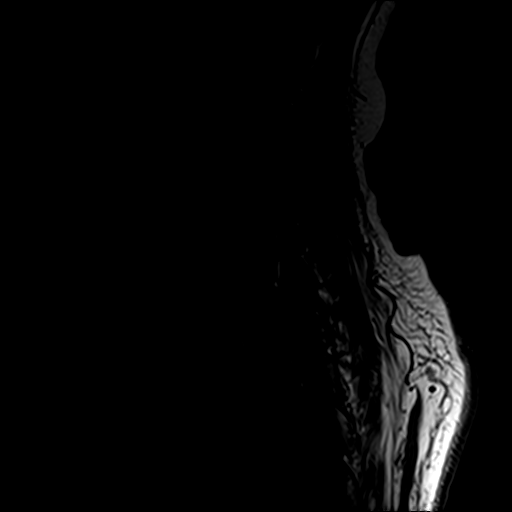
[im 8/15]
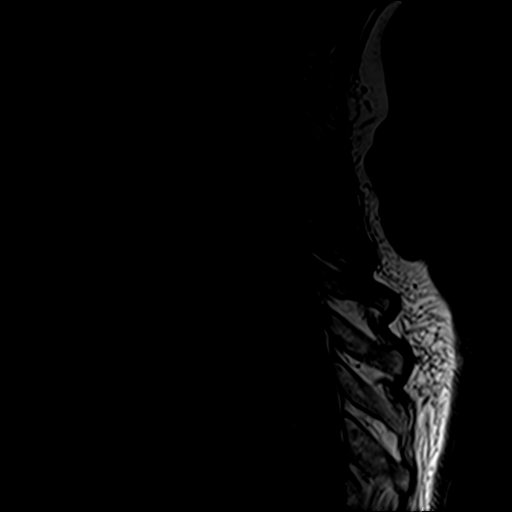
[im 10/15]
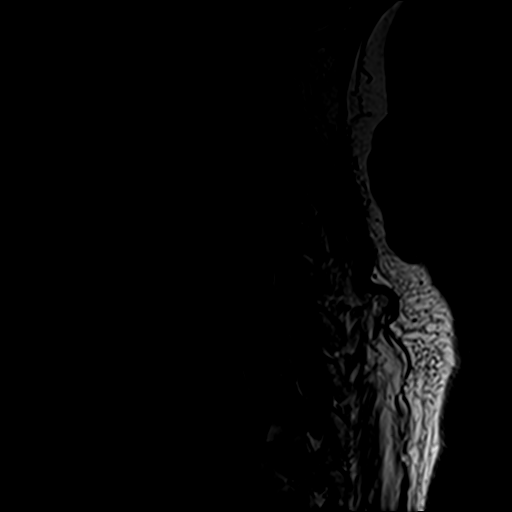
[im 12/15]
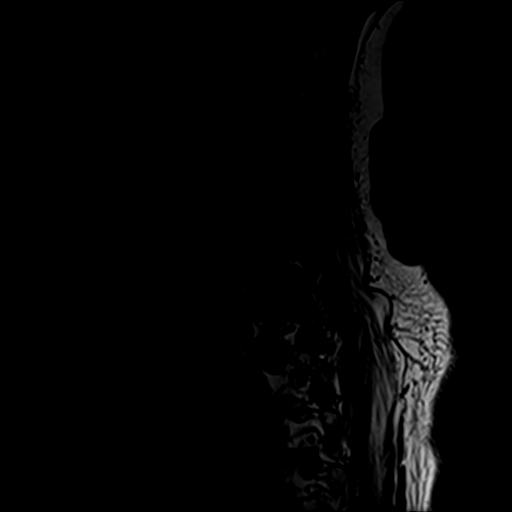
[im 15/15]
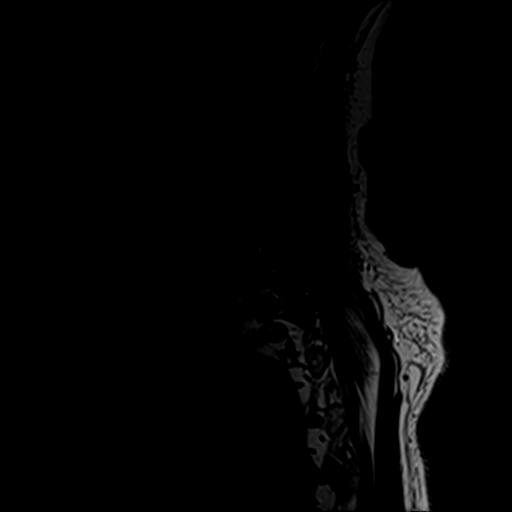

[Series 7: T2 · oblique · 3.0mm · 0.70mm/px · 8 of 29 slices shown (2 of 2)]
[im 1/29]
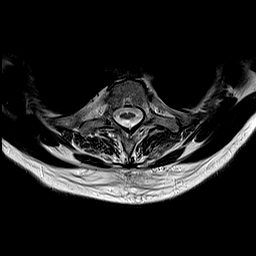
[im 5/29]
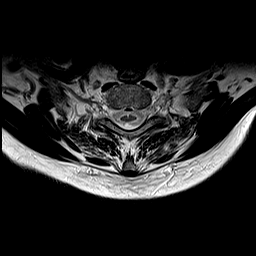
[im 9/29]
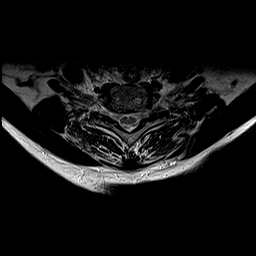
[im 13/29]
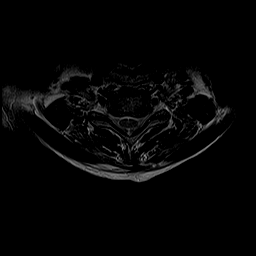
[im 16/29]
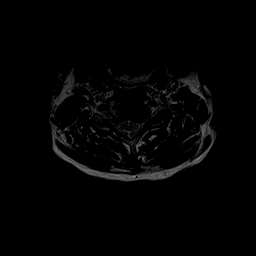
[im 20/29]
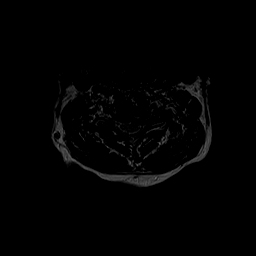
[im 24/29]
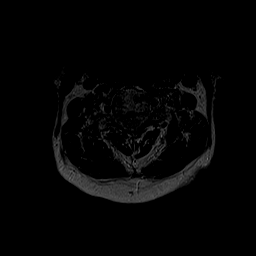
[im 29/29]
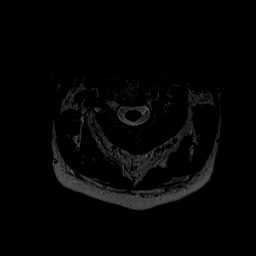

[28 of 48 positions shown; findings below may reference images not displayed]

FINDINGS: Alignment: Reversal of cervical lordosis. There is 3 mm of
anterolisthesis at C3-4.

Vertebrae: C2-3 and C3-4 facet ankylosis. No fracture, discitis, or
aggressive bone lesion.

Cord: Normal cord signal.

Posterior Fossa, vertebral arteries, paraspinal tissues: Negative

Disc levels:

C2-3: Intervertebral and facet ankylosis.  No impingement

C3-4: Advanced disc narrowing with endplate degeneration. Facet
degeneration with ankylosis. Canal and foramina are patent with mild
to moderate foraminal narrowing on the left.

C4-5: Advanced disc narrowing and asymmetric left endplate and
uncovertebral ridging. Left foraminal impingement is severe. Right
foraminal narrowing is mild. Disc osteophyte complex contacts the
ventral cord without compression

C5-6: Advanced disc narrowing with endplate and uncovertebral
ridging. Negative facets. Biforaminal impingement. Patent spinal
canal

C6-7: Disc narrowing and bulging with endplate ridging. Mild
uncovertebral spurring. Mild left foraminal narrowing.

C7-T1:Disc narrowing with asymmetric left uncovertebral ridging.
Mild facet spurring. Advanced left foraminal stenosis. Patent spinal
canal
IMPRESSION: 1. Degenerative reversal of cervical lordosis with C3-4
anterolisthesis. There is C2-3 and C3-4 facet ankylosis.
2. On the symptomatic right side there is advanced foraminal
impingement at C5-6.
3. On the left there is advanced foraminal impingement at C4-5,
C5-6, and C7-T1.
4. No cord compression.
